# Patient Record
Sex: Female | Born: 1995 | Hispanic: Yes | Marital: Single | Smoking: Never smoker
Health system: Southern US, Community
[De-identification: ages and names within clinical notes are randomized; demographics above are authoritative.]

## PROBLEM LIST (undated history)

## (undated) DIAGNOSIS — Z789 Other specified health status: Secondary | ICD-10-CM

## (undated) HISTORY — PX: NO PAST SURGERIES: SHX2092

---

## 2016-05-15 NOTE — L&D Delivery Note (Signed)
Delivery Note At 8:37 AM a viable female was delivered via Vaginal, Spontaneous Delivery (Presentation: cephalic ;OP  ).  APGAR: 8, 9; weight  .  pending Placenta status: ,manual delivery intact .  Cord:  3 vessels with the following complications: .  Cord pH: not sent  Anesthesia: epidural   Episiotomy: None Lacerations:  none Suture Repair: n/a Est. Blood Loss (mL):  500 ml. Patient received methergine and buccal cytotec after placental removal  Mom to postpartum.  Baby to Couplet care / Skin to Skin.  Scheryl Darter 12/31/2016, 9:07 AM

## 2016-07-04 LAB — OB RESULTS CONSOLE RUBELLA ANTIBODY, IGM
RUBELLA: IMMUNE
Rubella: IMMUNE

## 2016-10-17 LAB — HIV-1 RNA, QUALITATIVE, TMA: HIV-1 RNA, Qualitative, TMA: NONREACTIVE

## 2016-10-17 LAB — OB RESULTS CONSOLE RPR
RPR: NONREACTIVE
RPR: NONREACTIVE

## 2016-10-17 LAB — OB RESULTS CONSOLE HIV ANTIBODY (ROUTINE TESTING): HIV: NONREACTIVE

## 2016-11-30 ENCOUNTER — Encounter: Payer: Self-pay | Admitting: Obstetrics & Gynecology

## 2016-11-30 ENCOUNTER — Ambulatory Visit (INDEPENDENT_AMBULATORY_CARE_PROVIDER_SITE_OTHER): Payer: Self-pay | Admitting: Obstetrics & Gynecology

## 2016-11-30 VITALS — BP 127/74 | HR 92 | Ht 61.0 in | Wt 171.0 lb

## 2016-11-30 DIAGNOSIS — O0990 Supervision of high risk pregnancy, unspecified, unspecified trimester: Secondary | ICD-10-CM | POA: Insufficient documentation

## 2016-11-30 DIAGNOSIS — Z3400 Encounter for supervision of normal first pregnancy, unspecified trimester: Secondary | ICD-10-CM | POA: Insufficient documentation

## 2016-11-30 DIAGNOSIS — Z3403 Encounter for supervision of normal first pregnancy, third trimester: Secondary | ICD-10-CM

## 2016-11-30 NOTE — Patient Instructions (Signed)
Third Trimester of Pregnancy The third trimester is from week 28 through week 40 (months 7 through 9). The third trimester is a time when the unborn baby (fetus) is growing rapidly. At the end of the ninth month, the fetus is about 20 inches in length and weighs 6-10 pounds. Body changes during your third trimester Your body will continue to go through many changes during pregnancy. The changes vary from woman to woman. During the third trimester:  Your weight will continue to increase. You can expect to gain 25-35 pounds (11-16 kg) by the end of the pregnancy.  You may begin to get stretch marks on your hips, abdomen, and breasts.  You may urinate more often because the fetus is moving lower into your pelvis and pressing on your bladder.  You may develop or continue to have heartburn. This is caused by increased hormones that slow down muscles in the digestive tract.  You may develop or continue to have constipation because increased hormones slow digestion and cause the muscles that push waste through your intestines to relax.  You may develop hemorrhoids. These are swollen veins (varicose veins) in the rectum that can itch or be painful.  You may develop swollen, bulging veins (varicose veins) in your legs.  You may have increased body aches in the pelvis, back, or thighs. This is due to weight gain and increased hormones that are relaxing your joints.  You may have changes in your hair. These can include thickening of your hair, rapid growth, and changes in texture. Some women also have hair loss during or after pregnancy, or hair that feels dry or thin. Your hair will most likely return to normal after your baby is born.  Your breasts will continue to grow and they will continue to become tender. A yellow fluid (colostrum) may leak from your breasts. This is the first milk you are producing for your baby.  Your belly button may stick out.  You may notice more swelling in your hands,  face, or ankles.  You may have increased tingling or numbness in your hands, arms, and legs. The skin on your belly may also feel numb.  You may feel short of breath because of your expanding uterus.  You may have more problems sleeping. This can be caused by the size of your belly, increased need to urinate, and an increase in your body's metabolism.  You may notice the fetus "dropping," or moving lower in your abdomen (lightening).  You may have increased vaginal discharge.  You may notice your joints feel loose and you may have pain around your pelvic bone.  What to expect at prenatal visits You will have prenatal exams every 2 weeks until week 36. Then you will have weekly prenatal exams. During a routine prenatal visit:  You will be weighed to make sure you and the baby are growing normally.  Your blood pressure will be taken.  Your abdomen will be measured to track your baby's growth.  The fetal heartbeat will be listened to.  Any test results from the previous visit will be discussed.  You may have a cervical check near your due date to see if your cervix has softened or thinned (effaced).  You will be tested for Group B streptococcus. This happens between 35 and 37 weeks.  Your health care provider may ask you:  What your birth plan is.  How you are feeling.  If you are feeling the baby move.  If you have had   any abnormal symptoms, such as leaking fluid, bleeding, severe headaches, or abdominal cramping.  If you are using any tobacco products, including cigarettes, chewing tobacco, and electronic cigarettes.  If you have any questions.  Other tests or screenings that may be performed during your third trimester include:  Blood tests that check for low iron levels (anemia).  Fetal testing to check the health, activity level, and growth of the fetus. Testing is done if you have certain medical conditions or if there are problems during the  pregnancy.  Nonstress test (NST). This test checks the health of your baby to make sure there are no signs of problems, such as the baby not getting enough oxygen. During this test, a belt is placed around your belly. The baby is made to move, and its heart rate is monitored during movement.  What is false labor? False labor is a condition in which you feel small, irregular tightenings of the muscles in the womb (contractions) that usually go away with rest, changing position, or drinking water. These are called Braxton Hicks contractions. Contractions may last for hours, days, or even weeks before true labor sets in. If contractions come at regular intervals, become more frequent, increase in intensity, or become painful, you should see your health care provider. What are the signs of labor?  Abdominal cramps.  Regular contractions that start at 10 minutes apart and become stronger and more frequent with time.  Contractions that start on the top of the uterus and spread down to the lower abdomen and back.  Increased pelvic pressure and dull back pain.  A watery or bloody mucus discharge that comes from the vagina.  Leaking of amniotic fluid. This is also known as your "water breaking." It could be a slow trickle or a gush. Let your health care provider know if it has a color or strange odor. If you have any of these signs, call your health care provider right away, even if it is before your due date. Follow these instructions at home: Medicines  Follow your health care provider's instructions regarding medicine use. Specific medicines may be either safe or unsafe to take during pregnancy.  Take a prenatal vitamin that contains at least 600 micrograms (mcg) of folic acid.  If you develop constipation, try taking a stool softener if your health care provider approves. Eating and drinking  Eat a balanced diet that includes fresh fruits and vegetables, whole grains, good sources of protein  such as meat, eggs, or tofu, and low-fat dairy. Your health care provider will help you determine the amount of weight gain that is right for you.  Avoid raw meat and uncooked cheese. These carry germs that can cause birth defects in the baby.  If you have low calcium intake from food, talk to your health care provider about whether you should take a daily calcium supplement.  Eat four or five small meals rather than three large meals a day.  Limit foods that are high in fat and processed sugars, such as fried and sweet foods.  To prevent constipation: ? Drink enough fluid to keep your urine clear or pale yellow. ? Eat foods that are high in fiber, such as fresh fruits and vegetables, whole grains, and beans. Activity  Exercise only as directed by your health care provider. Most women can continue their usual exercise routine during pregnancy. Try to exercise for 30 minutes at least 5 days a week. Stop exercising if you experience uterine contractions.  Avoid heavy   lifting.  Do not exercise in extreme heat or humidity, or at high altitudes.  Wear low-heel, comfortable shoes.  Practice good posture.  You may continue to have sex unless your health care provider tells you otherwise. Relieving pain and discomfort  Take frequent breaks and rest with your legs elevated if you have leg cramps or low back pain.  Take warm sitz baths to soothe any pain or discomfort caused by hemorrhoids. Use hemorrhoid cream if your health care provider approves.  Wear a good support bra to prevent discomfort from breast tenderness.  If you develop varicose veins: ? Wear support pantyhose or compression stockings as told by your healthcare provider. ? Elevate your feet for 15 minutes, 3-4 times a day. Prenatal care  Write down your questions. Take them to your prenatal visits.  Keep all your prenatal visits as told by your health care provider. This is important. Safety  Wear your seat belt at  all times when driving.  Make a list of emergency phone numbers, including numbers for family, friends, the hospital, and police and fire departments. General instructions  Avoid cat litter boxes and soil used by cats. These carry germs that can cause birth defects in the baby. If you have a cat, ask someone to clean the litter box for you.  Do not travel far distances unless it is absolutely necessary and only with the approval of your health care provider.  Do not use hot tubs, steam rooms, or saunas.  Do not drink alcohol.  Do not use any products that contain nicotine or tobacco, such as cigarettes and e-cigarettes. If you need help quitting, ask your health care provider.  Do not use any medicinal herbs or unprescribed drugs. These chemicals affect the formation and growth of the baby.  Do not douche or use tampons or scented sanitary pads.  Do not cross your legs for long periods of time.  To prepare for the arrival of your baby: ? Take prenatal classes to understand, practice, and ask questions about labor and delivery. ? Make a trial run to the hospital. ? Visit the hospital and tour the maternity area. ? Arrange for maternity or paternity leave through employers. ? Arrange for family and friends to take care of pets while you are in the hospital. ? Purchase a rear-facing car seat and make sure you know how to install it in your car. ? Pack your hospital bag. ? Prepare the baby's nursery. Make sure to remove all pillows and stuffed animals from the baby's crib to prevent suffocation.  Visit your dentist if you have not gone during your pregnancy. Use a soft toothbrush to brush your teeth and be gentle when you floss. Contact a health care provider if:  You are unsure if you are in labor or if your water has broken.  You become dizzy.  You have mild pelvic cramps, pelvic pressure, or nagging pain in your abdominal area.  You have lower back pain.  You have persistent  nausea, vomiting, or diarrhea.  You have an unusual or bad smelling vaginal discharge.  You have pain when you urinate. Get help right away if:  Your water breaks before 37 weeks.  You have regular contractions less than 5 minutes apart before 37 weeks.  You have a fever.  You are leaking fluid from your vagina.  You have spotting or bleeding from your vagina.  You have severe abdominal pain or cramping.  You have rapid weight loss or weight gain.    You have shortness of breath with chest pain.  You notice sudden or extreme swelling of your face, hands, ankles, feet, or legs.  Your baby makes fewer than 10 movements in 2 hours.  You have severe headaches that do not go away when you take medicine.  You have vision changes. Summary  The third trimester is from week 28 through week 40, months 7 through 9. The third trimester is a time when the unborn baby (fetus) is growing rapidly.  During the third trimester, your discomfort may increase as you and your baby continue to gain weight. You may have abdominal, leg, and back pain, sleeping problems, and an increased need to urinate.  During the third trimester your breasts will keep growing and they will continue to become tender. A yellow fluid (colostrum) may leak from your breasts. This is the first milk you are producing for your baby.  False labor is a condition in which you feel small, irregular tightenings of the muscles in the womb (contractions) that eventually go away. These are called Braxton Hicks contractions. Contractions may last for hours, days, or even weeks before true labor sets in.  Signs of labor can include: abdominal cramps; regular contractions that start at 10 minutes apart and become stronger and more frequent with time; watery or bloody mucus discharge that comes from the vagina; increased pelvic pressure and dull back pain; and leaking of amniotic fluid. This information is not intended to replace advice  given to you by your health care provider. Make sure you discuss any questions you have with your health care provider. Document Released: 04/25/2001 Document Revised: 10/07/2015 Document Reviewed: 07/02/2012 Elsevier Interactive Patient Education  2017 [employer]  

## 2016-11-30 NOTE — Progress Notes (Signed)
Patient is a G1P0 34 week 6 days 21 y/o female who presents to the clinic today to establish care for her current and first pregnancy. Patient recently moved back to [place], [place] from [place], [place] to be with her mother during her pregnancy. She is having a girl. The pt had an [place] x1 month ago in [place], and her most recent prenatal visit was on 11/14/16 in [place]. Patient reports that her pregnancy has been without issue, and has not been informed of any abnormal growth or anatomical anomalies, nor any placental abnormalities. She has been taking a prenatal vitamin daily, and takes no other medications. Patient has no relevant past medical history, including no DM nor HTN. Denies HA and significant new onset lower extremity swelling. She does report some mild foot swelling.  She is uncertain about what contraception methods she is interested in utilizing postpartum. Patient plans on breastfeeding. She desires a vaginal delivery with epidural. Patient has not attended any prenatal classes. She has not yet decided upon a Pediatrician. Her boyfriend Elita Quick will attend her labor. She has not had a Pap smear performed in the past.    Gynecologic Exam  The patient's pertinent negatives include no vaginal bleeding or vaginal discharge. The patient is experiencing no pain. She is pregnant. Pertinent negatives include no abdominal pain, chills, dysuria, fever, frequency, headaches, hematuria, nausea, rash, urgency or vomiting. There has been no bleeding. There is no history of an abdominal surgery, a Cesarean section, an ectopic pregnancy, a gynecological surgery, herpes simplex, miscarriage, ovarian cysts, PID, an STD or a terminated pregnancy.    Review of Systems  Constitutional: Negative for chills and fever.  Respiratory: Negative for shortness of breath.   Cardiovascular: Negative for chest pain.  Gastrointestinal: Negative for abdominal pain, nausea and vomiting.  Genitourinary: Negative for dysuria,  frequency, hematuria, urgency and vaginal discharge.  Skin: Negative for rash.  Neurological: Negative for headaches.    Physical Exam  Constitutional: She is oriented to person, place, and time. She appears well-developed and well-nourished. No distress.  HENT:  Head: Normocephalic and atraumatic.  Abdominal: Soft. She exhibits no distension. There is no tenderness. There is no guarding.    Gravid, 33 cm FH  Musculoskeletal: Normal range of motion. She exhibits no edema, tenderness or deformity.  Neurological: She is alert and oriented to person, place, and time.  Skin: Skin is warm and dry.  Psychiatric: She has a normal mood and affect. Her behavior is normal.     Miia was seen today for new patient (initial visit).  Diagnoses and all orders for this visit:  Supervision of normal first pregnancy, antepartum Comments: .CWHBOX  Orders: -     Cancel: Obstetric Panel, Including HIV -     Cancel: Hemoglobinopathy Evaluation -     Culture, OB Urine -     GC/Chlamydia probe amp ([place])not at [facility]   Assessment  Patient is a G1P0 34 week 6 days 21 y/o female who presents to establish care. Patient has no concerns or complaints today and is overall doing very well. Her BP is 127/74 in office today, which is borderline, and with consideration of her age and primagravida, we will continue to closely monitor the patient for preeclampsia.  Plan  Routine prenatal labs and screening.  She is to return to office in x1 week for her next prenatal appointment and for GBS screen.  Attestation of Attending Supervision of PA Student: Evaluation and management procedures were performed by the  student under my supervision and collaboration.  I have reviewed the student's note and chart, and I agree with the management and plan.  Scheryl DarterJAMES ARNOLD, MD, FACOG Attending Obstetrician & Gynecologist Faculty Practice, [facility]

## 2016-12-02 LAB — CULTURE, OB URINE

## 2016-12-02 LAB — URINE CULTURE, OB REFLEX

## 2016-12-12 ENCOUNTER — Ambulatory Visit (INDEPENDENT_AMBULATORY_CARE_PROVIDER_SITE_OTHER): Payer: Self-pay | Admitting: Certified Nurse Midwife

## 2016-12-12 VITALS — BP 123/81 | HR 101 | Wt 171.8 lb

## 2016-12-12 DIAGNOSIS — Z113 Encounter for screening for infections with a predominantly sexual mode of transmission: Secondary | ICD-10-CM

## 2016-12-12 DIAGNOSIS — Z3403 Encounter for supervision of normal first pregnancy, third trimester: Secondary | ICD-10-CM

## 2016-12-12 DIAGNOSIS — Z3400 Encounter for supervision of normal first pregnancy, unspecified trimester: Secondary | ICD-10-CM

## 2016-12-12 LAB — OB RESULTS CONSOLE GBS: GBS: NEGATIVE

## 2016-12-12 LAB — OB RESULTS CONSOLE GC/CHLAMYDIA
CHLAMYDIA, DNA PROBE: NEGATIVE
Gonorrhea: NEGATIVE

## 2016-12-12 NOTE — Progress Notes (Signed)
   PRENATAL VISIT NOTE  Subjective:  Lydia Petersen is a 21 y.o. G1P0 at 5059w4d being seen today for ongoing prenatal care.  She is currently monitored for the following issues for this low-risk pregnancy and has Supervision of normal first pregnancy, antepartum on her problem list.  Patient reports no complaints.   . Vag. Bleeding: None.  Movement: Present. Denies leaking of fluid.   The following portions of the patient's history were reviewed and updated as appropriate: allergies, current medications, past family history, past medical history, past social history, past surgical history and problem list. Problem list updated.  Objective:   Vitals:   12/12/16 1039  BP: 123/81  Pulse: (!) 101  Weight: 171 lb 12.8 oz (77.9 kg)    Fetal Status: Fetal Heart Rate (bpm): 143 Fundal Height: 36 cm Movement: Present  Presentation: Vertex  General:  Alert, oriented and cooperative. Patient is in no acute distress.  Skin: Skin is warm and dry. No rash noted.   Cardiovascular: Normal heart rate noted  Respiratory: Normal respiratory effort, no problems with respiration noted  Abdomen: Soft, gravid, appropriate for gestational age.  Pain/Pressure: Present     Pelvic: Cervical exam deferred        Extremities: Normal range of motion.  Edema: Trace  Mental Status:  Normal mood and affect. Normal behavior. Normal judgment and thought content.   Assessment and Plan:  Pregnancy: G1P0 at 6159w4d  1. Encounter for supervision of normal first pregnancy in third trimester - [facility] Culture, beta strep (group b only) - GC/Chlamydia   Term labor symptoms and general obstetric precautions including but not limited to vaginal bleeding, contractions, leaking of fluid and fetal movement were reviewed in detail with the patient. Please refer to After Visit Summary for other counseling recommendations.  Return in about 1 week (around  12/19/2016).   Cleone Slimaroline Elian Gloster, Student-MidWife

## 2016-12-12 NOTE — Progress Notes (Signed)
123/81 

## 2016-12-13 LAB — CERVICOVAGINAL ANCILLARY ONLY
Chlamydia: NEGATIVE
NEISSERIA GONORRHEA: NEGATIVE

## 2016-12-16 LAB — CULTURE, BETA STREP (GROUP B ONLY): STREP GP B CULTURE: NEGATIVE

## 2016-12-19 ENCOUNTER — Ambulatory Visit (INDEPENDENT_AMBULATORY_CARE_PROVIDER_SITE_OTHER): Payer: Self-pay | Admitting: Obstetrics and Gynecology

## 2016-12-19 VITALS — BP 130/83 | HR 83 | Wt 173.1 lb

## 2016-12-19 DIAGNOSIS — Z3400 Encounter for supervision of normal first pregnancy, unspecified trimester: Secondary | ICD-10-CM

## 2016-12-19 DIAGNOSIS — Z3403 Encounter for supervision of normal first pregnancy, third trimester: Secondary | ICD-10-CM

## 2016-12-19 NOTE — Patient Instructions (Signed)
Vaginal Delivery Vaginal delivery means that you will give birth by pushing your baby out of your birth canal (vagina). A team of health care providers will help you before, during, and after vaginal delivery. Birth experiences are unique for every woman and every pregnancy, and birth experiences vary depending on where you choose to give birth. What should I do to prepare for my baby's birth? Before your baby is born, it is important to talk with your health care provider about:  Your labor and delivery preferences. These may include: ? Medicines that you may be given. ? How you will manage your pain. This might include non-medical pain relief techniques or injectable pain relief such as epidural analgesia. ? How you and your baby will be monitored during labor and delivery. ? Who may be in the labor and delivery room with you. ? Your feelings about surgical delivery of your baby (cesarean delivery, or C-section) if this becomes necessary. ? Your feelings about receiving donated blood through an IV tube (blood transfusion) if this becomes necessary.  Whether you are able: ? To take pictures or videos of the birth. ? To eat during labor and delivery. ? To move around, walk, or change positions during labor and delivery.  What to expect after your baby is born, such as: ? Whether delayed umbilical cord clamping and cutting is offered. ? Who will care for your baby right after birth. ? Medicines or tests that may be recommended for your baby. ? Whether breastfeeding is supported in your hospital or birth center. ? How long you will be in the hospital or birth center.  How any medical conditions you have may affect your baby or your labor and delivery experience.  To prepare for your baby's birth, you should also:  Attend all of your health care visits before delivery (prenatal visits) as recommended by your health care provider. This is important.  Prepare your home for your baby's  arrival. Make sure that you have: ? Diapers. ? Baby clothing. ? Feeding equipment. ? Safe sleeping arrangements for you and your baby.  Install a car seat in your vehicle. Have your car seat checked by a certified car seat installer to make sure that it is installed safely.  Think about who will help you with your new baby at home for at least the first several weeks after delivery.  What can I expect when I arrive at the birth center or hospital? Once you are in labor and have been admitted into the hospital or birth center, your health care provider may:  Review your pregnancy history and any concerns you have.  Insert an IV tube into one of your veins. This is used to give you fluids and medicines.  Check your blood pressure, pulse, temperature, and heart rate (vital signs).  Check whether your bag of water (amniotic sac) has broken (ruptured).  Talk with you about your birth plan and discuss pain control options.  Monitoring Your health care provider may monitor your contractions (uterine monitoring) and your baby's heart rate (fetal monitoring). You may need to be monitored:  Often, but not continuously (intermittently).  All the time or for long periods at a time (continuously). Continuous monitoring may be needed if: ? You are taking certain medicines, such as medicine to relieve pain or make your contractions stronger. ? You have pregnancy or labor complications.  Monitoring may be done by:  Placing a special stethoscope or a handheld monitoring device on your abdomen to   check your baby's heartbeat, and feeling your abdomen for contractions. This method of monitoring does not continuously record your baby's heartbeat or your contractions.  Placing monitors on your abdomen (external monitors) to record your baby's heartbeat and the frequency and length of contractions. You may not have to wear external monitors all the time.  Placing monitors inside of your uterus  (internal monitors) to record your baby's heartbeat and the frequency, length, and strength of your contractions. ? Your health care provider may use internal monitors if he or she needs more information about the strength of your contractions or your baby's heart rate. ? Internal monitors are put in place by passing a thin, flexible wire through your vagina and into your uterus. Depending on the type of monitor, it may remain in your uterus or on your baby's head until birth. ? Your health care provider will discuss the benefits and risks of internal monitoring with you and will ask for your permission before inserting the monitors.  Telemetry. This is a type of continuous monitoring that can be done with external or internal monitors. Instead of having to stay in bed, you are able to move around during telemetry. Ask your health care provider if telemetry is an option for you.  Physical exam Your health care provider may perform a physical exam. This may include:  Checking whether your baby is positioned: ? With the head toward your vagina (head-down). This is most common. ? With the head toward the top of your uterus (head-up or breech). If your baby is in a breech position, your health care provider may try to turn your baby to a head-down position so you can deliver vaginally. If it does not seem that your baby can be born vaginally, your provider may recommend surgery to deliver your baby. In rare cases, you may be able to deliver vaginally if your baby is head-up (breech delivery). ? Lying sideways (transverse). Babies that are lying sideways cannot be delivered vaginally.  Checking your cervix to determine: ? Whether it is thinning out (effacing). ? Whether it is opening up (dilating). ? How low your baby has moved into your birth canal.  What are the three stages of labor and delivery?  Normal labor and delivery is divided into the following three stages: Stage 1  Stage 1 is the  longest stage of labor, and it can last for hours or days. Stage 1 includes: ? Early labor. This is when contractions may be irregular, or regular and mild. Generally, early labor contractions are more than 10 minutes apart. ? Active labor. This is when contractions get longer, more regular, more frequent, and more intense. ? The transition phase. This is when contractions happen very close together, are very intense, and may last longer than during any other part of labor.  Contractions generally feel mild, infrequent, and irregular at first. They get stronger, more frequent (about every 2-3 minutes), and more regular as you progress from early labor through active labor and transition.  Many women progress through stage 1 naturally, but you may need help to continue making progress. If this happens, your health care provider may talk with you about: ? Rupturing your amniotic sac if it has not ruptured yet. ? Giving you medicine to help make your contractions stronger and more frequent.  Stage 1 ends when your cervix is completely dilated to 4 inches (10 cm) and completely effaced. This happens at the end of the transition phase. Stage 2  Once   your cervix is completely effaced and dilated to 4 inches (10 cm), you may start to feel an urge to push. It is common for the body to naturally take a rest before feeling the urge to push, especially if you received an epidural or certain other pain medicines. This rest period may last for up to 1-2 hours, depending on your unique labor experience.  During stage 2, contractions are generally less painful, because pushing helps relieve contraction pain. Instead of contraction pain, you may feel stretching and burning pain, especially when the widest part of your baby's head passes through the vaginal opening (crowning).  Your health care provider will closely monitor your pushing progress and your baby's progress through the vagina during stage 2.  Your  health care provider may massage the area of skin between your vaginal opening and anus (perineum) or apply warm compresses to your perineum. This helps it stretch as the baby's head starts to crown, which can help prevent perineal tearing. ? In some cases, an incision may be made in your perineum (episiotomy) to allow the baby to pass through the vaginal opening. An episiotomy helps to make the opening of the vagina larger to allow more room for the baby to fit through.  It is very important to breathe and focus so your health care provider can control the delivery of your baby's head. Your health care provider may have you decrease the intensity of your pushing, to help prevent perineal tearing.  After delivery of your baby's head, the shoulders and the rest of the body generally deliver very quickly and without difficulty.  Once your baby is delivered, the umbilical cord may be cut right away, or this may be delayed for 1-2 minutes, depending on your baby's health. This may vary among health care providers, hospitals, and birth centers.  If you and your baby are healthy enough, your baby may be placed on your chest or abdomen to help maintain the baby's temperature and to help you bond with each other. Some mothers and babies start breastfeeding at this time. Your health care team will dry your baby and help keep your baby warm during this time.  Your baby may need immediate care if he or she: ? Showed signs of distress during labor. ? Has a medical condition. ? Was born too early (prematurely). ? Had a bowel movement before birth (meconium). ? Shows signs of difficulty transitioning from being inside the uterus to being outside of the uterus. If you are planning to breastfeed, your health care team will help you begin a feeding. Stage 3  The third stage of labor starts immediately after the birth of your baby and ends after you deliver the placenta. The placenta is an organ that develops  during pregnancy to provide oxygen and nutrients to your baby in the womb.  Delivering the placenta may require some pushing, and you may have mild contractions. Breastfeeding can stimulate contractions to help you deliver the placenta.  After the placenta is delivered, your uterus should tighten (contract) and become firm. This helps to stop bleeding in your uterus. To help your uterus contract and to control bleeding, your health care provider may: ? Give you medicine by injection, through an IV tube, by mouth, or through your rectum (rectally). ? Massage your abdomen or perform a vaginal exam to remove any blood clots that are left in your uterus. ? Empty your bladder by placing a thin, flexible tube (catheter) into your bladder. ? Encourage   you to breastfeed your baby. After labor is over, you and your baby will be monitored closely to ensure that you are both healthy until you are ready to go home. Your health care team will teach you how to care for yourself and your baby. This information is not intended to replace advice given to you by your health care provider. Make sure you discuss any questions you have with your health care provider. Document Released: 02/08/2008 Document Revised: 11/19/2015 Document Reviewed: 05/16/2015 Elsevier Interactive Patient Education  2018 [employer]  

## 2016-12-19 NOTE — Progress Notes (Signed)
Subjective:  Lydia Petersen is a 21 y.o. G1P0 at 6860w4d being seen today for ongoing prenatal care.  She is currently monitored for the following issues for this low-risk pregnancy and has Supervision of normal first pregnancy, antepartum on her problem list.  Patient reports no complaints.  Contractions: Irregular. Vag. Bleeding: None.  Movement: Present. Denies leaking of fluid.   The following portions of the patient's history were reviewed and updated as appropriate: allergies, current medications, past family history, past medical history, past social history, past surgical history and problem list. Problem list updated.  Objective:   Vitals:   12/19/16 0828  BP: 130/83  Pulse: 83  Weight: 173 lb 1.6 oz (78.5 kg)    Fetal Status: Fetal Heart Rate (bpm): 154 Fundal Height: 37 cm Movement: Present  Presentation: Vertex  General:  Alert, oriented and cooperative. Patient is in no acute distress.  Skin: Skin is warm and dry. No rash noted.   Cardiovascular: Normal heart rate noted  Respiratory: Normal respiratory effort, no problems with respiration noted  Abdomen: Soft, gravid, appropriate for gestational age. Pain/Pressure: Present     Pelvic: Vag. Bleeding: None     Cervical exam performed Dilation: Fingertip Effacement (%): Thick Station: -3  Extremities: Normal range of motion.  Edema: Trace  Mental Status: Normal mood and affect. Normal behavior. Normal judgment and thought content.    Assessment and Plan:  Pregnancy: G1P0 at 2160w4d  1. Supervision of normal first pregnancy, antepartum Continue routine prenatal care. Labs from last visit reviewed. Patient is GBS negative. Still awaiting records from [facility]. Reviewed labs on patient's MyChart from prior OB and updated information.   Term labor symptoms and general obstetric precautions including but not limited to vaginal bleeding, contractions, leaking of fluid and fetal movement were reviewed in detail with the  patient. Please refer to After Visit Summary for other counseling recommendations.  Return in about 1 week (around 12/26/2016) for ob visit.  Caryl AdaJazma Diella Gillingham, DO [facility], [facility] 12/19/2016, 8:55 AM

## 2016-12-26 ENCOUNTER — Ambulatory Visit (INDEPENDENT_AMBULATORY_CARE_PROVIDER_SITE_OTHER): Payer: Self-pay | Admitting: Obstetrics and Gynecology

## 2016-12-26 VITALS — BP 137/83 | HR 86 | Wt 172.9 lb

## 2016-12-26 DIAGNOSIS — B379 Candidiasis, unspecified: Secondary | ICD-10-CM | POA: Insufficient documentation

## 2016-12-26 DIAGNOSIS — Z3403 Encounter for supervision of normal first pregnancy, third trimester: Secondary | ICD-10-CM

## 2016-12-26 DIAGNOSIS — Z3400 Encounter for supervision of normal first pregnancy, unspecified trimester: Secondary | ICD-10-CM

## 2016-12-26 MED ORDER — TERCONAZOLE 0.4 % VA CREA: 1.0000 | TOPICAL_CREAM | Freq: Every day | VAGINAL | 0 refills | Status: DC

## 2016-12-26 NOTE — Progress Notes (Addendum)
   PRENATAL VISIT NOTE  Subjective:  Lydia Petersen is a 21 y.o. G1P0 at 7276w4d being seen today for ongoing prenatal care.  She is currently monitored for the following issues for this low-risk pregnancy and has Supervision of normal first pregnancy, antepartum and Yeast infection on her problem list.  Patient reports an increase in clear vaginal discharge. Not everyday, however at times she feels her underwear are wet. .  Contractions: Irregular. Vag. Bleeding: None.  Movement: Present. Denies leaking of fluid.   The following portions of the patient's history were reviewed and updated as appropriate: allergies, current medications, past family history, past medical history, past social history, past surgical history and problem list. Problem list updated.  Objective:   Vitals:   12/26/16 0955  BP: 137/83  Pulse: 86  Weight: 172 lb 14.4 oz (78.4 kg)    Fetal Status: Fetal Heart Rate (bpm): 142 Fundal Height: 37 cm Movement: Present     General:  Alert, oriented and cooperative. Patient is in no acute distress.  Skin: Skin is warm and dry. No rash noted.   Cardiovascular: Normal heart rate noted  Respiratory: Normal respiratory effort, no problems with respiration noted  Abdomen: Soft, gravid, appropriate for gestational age.  Pain/Pressure: Absent     Pelvic: Cervical exam performed       moderate amount of clear, thick/white, clumpy discharge on the walls of vagina. No pooling of fluid. Cervical check deferred. Fern slide negative.   Extremities: Normal range of motion.  Edema: Trace  Mental Status:  Normal mood and affect. Normal behavior. Normal judgment and thought content.   Assessment and Plan:  Pregnancy: G1P0 at 276w4d  1. Supervision of normal first pregnancy, antepartum  - Records again requested. No US on file of date. Discussed with Dr. Adrian BlackwaterStinson. US recommended.  - GBS negative, discussed with patient.  - US MFM OB COMP + 14 WK; Future  2. Yeast  infection  - terconazole (TERAZOL 7) 0.4 % vaginal cream; Place 1 applicator vaginally at bedtime.  Dispense: 45 g; Refill: 0   Term labor symptoms and general obstetric precautions including but not limited to vaginal bleeding, contractions, leaking of fluid and fetal movement were reviewed in detail with the patient. Please refer to After Visit Summary for other counseling recommendations.  Return in about 1 week (around 01/02/2017).    Ilani Otterson, Harolyn RutherfordJennifer I, NP

## 2016-12-28 ENCOUNTER — Other Ambulatory Visit: Payer: Self-pay | Admitting: Obstetrics and Gynecology

## 2016-12-28 ENCOUNTER — Ambulatory Visit (HOSPITAL_COMMUNITY)
Admission: RE | Admit: 2016-12-28 | Discharge: 2016-12-28 | Disposition: A | Discharge status: Home / Self Care | Payer: Self-pay | Source: Ambulatory Visit | Attending: Obstetrics and Gynecology | Admitting: Obstetrics and Gynecology

## 2016-12-28 DIAGNOSIS — Z3689 Encounter for other specified antenatal screening: Secondary | ICD-10-CM | POA: Insufficient documentation

## 2016-12-28 DIAGNOSIS — Z3400 Encounter for supervision of normal first pregnancy, unspecified trimester: Secondary | ICD-10-CM

## 2016-12-28 DIAGNOSIS — Z3A38 38 weeks gestation of pregnancy: Secondary | ICD-10-CM | POA: Insufficient documentation

## 2016-12-29 ENCOUNTER — Inpatient Hospital Stay (HOSPITAL_COMMUNITY)
Admission: AD | Admit: 2016-12-29 | Discharge: 2017-01-02 | DRG: 774 | Disposition: A | Discharge status: Home / Self Care | Payer: Medicaid Other | Source: Ambulatory Visit | Attending: Obstetrics and Gynecology | Admitting: Obstetrics and Gynecology

## 2016-12-29 ENCOUNTER — Encounter (HOSPITAL_COMMUNITY): Payer: Self-pay

## 2016-12-29 DIAGNOSIS — O864 Pyrexia of unknown origin following delivery: Secondary | ICD-10-CM | POA: Diagnosis not present

## 2016-12-29 DIAGNOSIS — O4292 Full-term premature rupture of membranes, unspecified as to length of time between rupture and onset of labor: Secondary | ICD-10-CM | POA: Diagnosis present

## 2016-12-29 DIAGNOSIS — O4290 Premature rupture of membranes, unspecified as to length of time between rupture and onset of labor, unspecified weeks of gestation: Secondary | ICD-10-CM | POA: Diagnosis present

## 2016-12-29 DIAGNOSIS — Z3A39 39 weeks gestation of pregnancy: Secondary | ICD-10-CM

## 2016-12-29 DIAGNOSIS — O4212 Full-term premature rupture of membranes, onset of labor more than 24 hours following rupture: Secondary | ICD-10-CM | POA: Diagnosis not present

## 2016-12-29 HISTORY — DX: Other specified health status: Z78.9

## 2016-12-29 LAB — COMPREHENSIVE METABOLIC PANEL
ALBUMIN: 2.8 g/dL — AB (ref 3.5–5.0)
ALT: 19 U/L (ref 14–54)
ANION GAP: 12 (ref 5–15)
AST: 20 U/L (ref 15–41)
Alkaline Phosphatase: 221 U/L — ABNORMAL HIGH (ref 38–126)
BILIRUBIN TOTAL: 0.5 mg/dL (ref 0.3–1.2)
BUN: 18 mg/dL (ref 6–20)
CHLORIDE: 107 mmol/L (ref 101–111)
CO2: 18 mmol/L — ABNORMAL LOW (ref 22–32)
Calcium: 9.1 mg/dL (ref 8.9–10.3)
Creatinine, Ser: 0.72 mg/dL (ref 0.44–1.00)
GFR calc Af Amer: >60 mL/min (ref >60)
GLUCOSE: 93 mg/dL (ref 65–99)
POTASSIUM: 3.9 mmol/L (ref 3.5–5.1)
Sodium: 137 mmol/L (ref 135–145)
TOTAL PROTEIN: 6.4 g/dL — AB (ref 6.5–8.1)

## 2016-12-29 LAB — URINALYSIS, ROUTINE W REFLEX MICROSCOPIC
BILIRUBIN URINE: NEGATIVE
GLUCOSE, UA: NEGATIVE mg/dL
KETONES UR: NEGATIVE mg/dL
Nitrite: NEGATIVE
PROTEIN: 100 mg/dL — AB
Specific Gravity, Urine: 1.024 (ref 1.005–1.030)
pH: 6 (ref 5.0–8.0)

## 2016-12-29 LAB — CBC
HEMATOCRIT: 38.9 % (ref 36.0–46.0)
Hemoglobin: 13 g/dL (ref 12.0–15.0)
MCH: 29.2 pg (ref 26.0–34.0)
MCHC: 33.4 g/dL (ref 30.0–36.0)
MCV: 87.4 fL (ref 78.0–100.0)
PLATELETS: 184 10*3/uL (ref 150–400)
RBC: 4.45 MIL/uL (ref 3.87–5.11)
RDW: 14.2 % (ref 11.5–15.5)
WBC: 8.8 10*3/uL (ref 4.0–10.5)

## 2016-12-29 LAB — POCT FERN TEST: POCT Fern Test: POSITIVE

## 2016-12-29 LAB — PROTEIN / CREATININE RATIO, URINE
Creatinine, Urine: 212 mg/dL
PROTEIN CREATININE RATIO: 0.45 mg/mg{creat} — AB (ref 0.00–0.15)
TOTAL PROTEIN, URINE: 95 mg/dL

## 2016-12-29 LAB — TYPE AND SCREEN
ABO/RH(D): O POS
ANTIBODY SCREEN: NEGATIVE

## 2016-12-29 MED ORDER — EPHEDRINE 5 MG/ML INJ
10.0000 mg | INTRAVENOUS | Status: DC | PRN
Start: 2016-12-29 — End: 2017-01-02
  Filled 2016-12-29: qty 2

## 2016-12-29 MED ORDER — OXYCODONE-ACETAMINOPHEN 5-325 MG PO TABS: 2.0000 | ORAL_TABLET | ORAL | Status: DC | PRN

## 2016-12-29 MED ORDER — LACTATED RINGERS IV SOLN: 500.0000 mL | INTRAVENOUS | Status: DC | PRN

## 2016-12-29 MED ORDER — FENTANYL 2.5 MCG/ML BUPIVACAINE 1/10 % EPIDURAL INFUSION (WH - ANES)
14.0000 mL/h | INTRAMUSCULAR | Status: DC | PRN
  Administered 2016-12-30 (×2): 14 mL/h via EPIDURAL
  Administered 2016-12-30: 12 mL/h via EPIDURAL
  Filled 2016-12-29 (×2): qty 100

## 2016-12-29 MED ORDER — FENTANYL CITRATE (PF) 100 MCG/2ML IJ SOLN
50.0000 ug | INTRAMUSCULAR | Status: DC | PRN
  Administered 2016-12-30: 100 ug via INTRAVENOUS
  Filled 2016-12-29: qty 2

## 2016-12-29 MED ORDER — OXYTOCIN BOLUS FROM INFUSION
500.0000 mL | Freq: Once | INTRAVENOUS | Status: AC
  Administered 2016-12-31: 500 mL via INTRAVENOUS

## 2016-12-29 MED ORDER — SOD CITRATE-CITRIC ACID 500-334 MG/5ML PO SOLN: 30.0000 mL | ORAL | Status: DC | PRN

## 2016-12-29 MED ORDER — ACETAMINOPHEN 325 MG PO TABS
650.0000 mg | ORAL_TABLET | ORAL | Status: DC | PRN
  Administered 2016-12-31: 650 mg via ORAL
  Filled 2016-12-29: qty 2

## 2016-12-29 MED ORDER — LACTATED RINGERS IV SOLN
500.0000 mL | Freq: Once | INTRAVENOUS | Status: AC
  Administered 2016-12-30: 500 mL via INTRAVENOUS

## 2016-12-29 MED ORDER — LIDOCAINE HCL (PF) 1 % IJ SOLN
30.0000 mL | INTRAMUSCULAR | Status: DC | PRN
  Filled 2016-12-29: qty 30

## 2016-12-29 MED ORDER — DIPHENHYDRAMINE HCL 50 MG/ML IJ SOLN: 12.5000 mg | INTRAMUSCULAR | Status: DC | PRN

## 2016-12-29 MED ORDER — OXYCODONE-ACETAMINOPHEN 5-325 MG PO TABS: 1.0000 | ORAL_TABLET | ORAL | Status: DC | PRN

## 2016-12-29 MED ORDER — PHENYLEPHRINE 40 MCG/ML (10ML) SYRINGE FOR IV PUSH (FOR BLOOD PRESSURE SUPPORT)
80.0000 ug | PREFILLED_SYRINGE | INTRAVENOUS | Status: DC | PRN
Start: 2016-12-29 — End: 2017-01-02
  Filled 2016-12-29: qty 5
  Filled 2016-12-29: qty 10

## 2016-12-29 MED ORDER — PHENYLEPHRINE 40 MCG/ML (10ML) SYRINGE FOR IV PUSH (FOR BLOOD PRESSURE SUPPORT)
80.0000 ug | PREFILLED_SYRINGE | INTRAVENOUS | Status: DC | PRN
  Filled 2016-12-29: qty 5

## 2016-12-29 MED ORDER — LACTATED RINGERS IV SOLN
INTRAVENOUS | Status: DC
  Administered 2016-12-30 (×2): 125 mL/h via INTRAVENOUS
  Administered 2016-12-30 – 2016-12-31 (×2): via INTRAVENOUS

## 2016-12-29 MED ORDER — OXYTOCIN 40 UNITS IN LACTATED RINGERS INFUSION - SIMPLE MED
2.5000 [IU]/h | INTRAVENOUS | Status: DC
  Administered 2016-12-31: 2.5 [IU]/h via INTRAVENOUS

## 2016-12-29 MED ORDER — ONDANSETRON HCL 4 MG/2ML IJ SOLN: 4.0000 mg | Freq: Four times a day (QID) | INTRAMUSCULAR | Status: DC | PRN

## 2016-12-29 NOTE — Progress Notes (Addendum)
Pt in WC with belongings to rm 174.

## 2016-12-29 NOTE — H&P (Signed)
Lydia Petersen is a 21 y.o. female G1P0 at 76 weeks presenting for evaluation of leakage of fluid. Patient reports soaking a pad an hour ago with continuous leakage on her way to the hospital. She reports some mild contractions. She has had uncomplicated prenatal care at [facility] since 34 weeks (transfer from [place]). She reports good fetal movement and denies vaginal bleeding. She denies HA, visual changes, RUQ/epigastric pain  OB History    Gravida Para Term Preterm AB Living   1             SAB TAB Ectopic Multiple Live Births                 Past Medical History:  Diagnosis Date  . Medical history non-contributory    Past Surgical History:  Procedure Laterality Date  . NO PAST SURGERIES     Family History: family history is not on file. Social History:  reports that she has never smoked. She has never used smokeless tobacco. She reports that she does not drink alcohol or use drugs.     Maternal Diabetes: No Genetic Screening: Declined Maternal Ultrasounds/Referrals: Normal Fetal Ultrasounds or other Referrals:  None Maternal Substance Abuse:  No Significant Maternal Medications:  None Significant Maternal Lab Results:  None Other Comments:  None  ROS  See pertinent in HPI History   Blood pressure (!) 135/96, temperature 97.8 F (36.6 C), resp. rate 18, height 5\' 1"  (1.549 m), weight 173 lb (78.5 kg), last menstrual period 03/31/2016. Exam Physical Exam  GENERAL: Well-developed, well-nourished female in no acute distress.  LUNGS: Clear to auscultation bilaterally.  HEART: Regular rate and rhythm. ABDOMEN: Soft, nontender, gravid CERVIX: 2/50/-3. + pooling + ferning EXTREMITIES: No cyanosis, clubbing, or edema, 2+ distal pulses.  Prenatal labs: ABO, Rh:  O pos Antibody:  Neg Rubella: Immune (02/20 0000) RPR: Nonreactive (06/05 0000)  HBsAg:   Negative HIV:   NR GBS:   Negative  Assessment/Plan: 21 yo G1P0 at 39 weeks with PROM - Admit to L&D - Pain  management prn - Will send cbc, cmp and U P:C given elevated BP  - Patient plans to breast feed and is undecided on contraception   Victorio Creeden 12/29/2016, 8:52 PM

## 2016-12-29 NOTE — MAU Note (Signed)
Leaked fld to where I soaked a regular pad about an hour ago. Clear fld. Cont to leak fld. Feel stomach tightening but not painful. Was one cm last sve

## 2016-12-30 ENCOUNTER — Inpatient Hospital Stay (HOSPITAL_COMMUNITY): Payer: Medicaid Other | Admitting: Anesthesiology

## 2016-12-30 LAB — ABO/RH: ABO/RH(D): O POS

## 2016-12-30 LAB — RPR: RPR: NONREACTIVE

## 2016-12-30 MED ORDER — EPHEDRINE 5 MG/ML INJ
10.0000 mg | INTRAVENOUS | Status: DC | PRN
Start: 2016-12-30 — End: 2016-12-30

## 2016-12-30 MED ORDER — DIPHENHYDRAMINE HCL 50 MG/ML IJ SOLN: 12.5000 mg | INTRAMUSCULAR | Status: DC | PRN

## 2016-12-30 MED ORDER — PHENYLEPHRINE 40 MCG/ML (10ML) SYRINGE FOR IV PUSH (FOR BLOOD PRESSURE SUPPORT): 80.0000 ug | PREFILLED_SYRINGE | INTRAVENOUS | Status: DC | PRN

## 2016-12-30 MED ORDER — EPHEDRINE 5 MG/ML INJ: 10.0000 mg | INTRAVENOUS | Status: DC | PRN

## 2016-12-30 MED ORDER — TERBUTALINE SULFATE 1 MG/ML IJ SOLN
0.2500 mg | Freq: Once | INTRAMUSCULAR | Status: DC | PRN
  Filled 2016-12-30: qty 1

## 2016-12-30 MED ORDER — LACTATED RINGERS IV SOLN: 500.0000 mL | Freq: Once | INTRAVENOUS | Status: DC

## 2016-12-30 MED ORDER — LIDOCAINE HCL (PF) 1 % IJ SOLN
INTRAMUSCULAR | Status: DC | PRN
Start: 2016-12-30 — End: 2016-12-31
  Administered 2016-12-30 (×2): 5 mL via EPIDURAL

## 2016-12-30 MED ORDER — OXYTOCIN 40 UNITS IN LACTATED RINGERS INFUSION - SIMPLE MED
1.0000 m[IU]/min | INTRAVENOUS | Status: DC
  Administered 2016-12-30: 2 m[IU]/min via INTRAVENOUS
  Filled 2016-12-30: qty 1000

## 2016-12-30 MED ORDER — FENTANYL CITRATE (PF) 100 MCG/2ML IJ SOLN: 100.0000 ug | INTRAMUSCULAR | Status: DC | PRN

## 2016-12-30 MED ORDER — FENTANYL 2.5 MCG/ML BUPIVACAINE 1/10 % EPIDURAL INFUSION (WH - ANES): 14.0000 mL/h | INTRAMUSCULAR | Status: DC | PRN

## 2016-12-30 NOTE — Progress Notes (Signed)
Patient ID: Lydia Petersen, female   DOB: 06-30-1995, 21 y.o.   MRN: [id number]  Pt resting; feels ctx less often  BP 105/63, 103/61, other VSS FHR 120-130s, +accels, no decels Irreg ctx Cx deferred  IUP@term  PROM ? Latent labor GBS neg  Rev'd w/ pt the options of augmenting labor with Pit vs expectant management; pt elects for expectant management now. Exam deferred due to rupture and no desire for Pit currently.  Cam Hai CNM 12/30/2016 2:35 AM

## 2016-12-30 NOTE — Anesthesia Preprocedure Evaluation (Signed)
Anesthesia Evaluation  Patient identified by MRN, date of birth, ID band Patient awake    Reviewed: Allergy & Precautions, NPO status , Patient's Chart, lab work & pertinent test results  History of Anesthesia Complications Negative for: history of anesthetic complications  Airway Mallampati: III  TM Distance: >3 FB Neck ROM: Full    Dental  (+) Dental Advisory Given   Pulmonary neg pulmonary ROS,    breath sounds clear to auscultation       Cardiovascular negative cardio ROS   Rhythm:Regular Rate:Normal     Neuro/Psych negative neurological ROS     GI/Hepatic Neg liver ROS, GERD  ,  Endo/Other  negative endocrine ROS  Renal/GU negative Renal ROS     Musculoskeletal   Abdominal   Peds  Hematology plt 184k   Anesthesia Other Findings   Reproductive/Obstetrics (+) Pregnancy                             Anesthesia Physical Anesthesia Plan  ASA: II  Anesthesia Plan: Epidural   Post-op Pain Management:    Induction:   PONV Risk Score and Plan: 2 and Treatment may vary due to age or medical condition  Airway Management Planned: Natural Airway  Additional Equipment:   Intra-op Plan:   Post-operative Plan:   Informed Consent: I have reviewed the patients History and Physical, chart, labs and discussed the procedure including the risks, benefits and alternatives for the proposed anesthesia with the patient or authorized representative who has indicated his/her understanding and acceptance.   Dental advisory given  Plan Discussed with: CRNA and Surgeon  Anesthesia Plan Comments: (Patient identified. Risks/Benefits/Options discussed with patient including but not limited to bleeding, infection, nerve damage, paralysis, failed block, incomplete pain control, headache, blood pressure changes, nausea, vomiting, reactions to medication both or allergic, itching and postpartum back pain.  Confirmed with bedside nurse the patient's most recent platelet count. Confirmed with patient that they are not currently taking any anticoagulation, have any bleeding history or any family history of bleeding disorders. Patient expressed understanding and wished to proceed. All questions were answered. )        Anesthesia Quick Evaluation

## 2016-12-30 NOTE — Progress Notes (Signed)
Labor Progress Note  S: Patient seen & examined for progress of labor. Patient comfortable and has no complaints or concerns.  O: BP 124/77   Pulse 78   Temp 98.1 F (36.7 C) (Oral)   Resp 20   Ht 5\' 1"  (1.549 m)   Wt 78.5 kg (173 lb)   LMP 03/31/2016   BMI 32.69 kg/m   FHT: 150bpm, mod var, +accels, no decels TOCO: q59min, patient looks comfortable during contractions  CVE: Dilation: 4 Effacement (%): 70 Station: -3 Presentation: Vertex Exam by:: Malva Cogan RN   Presentation cephalic verified by bedside US.  A&P: 21 y.o. G1P0 [redacted]w[redacted]d here for spontaneous ROM and labor management.  Will continue to monitor blood pressure closely given urine protein/creatinine ratio of 0.45.  Other preeclampsia labs negative. Will start 2 milli-units of pitocin. Continue labor augmentation Anticipate SVD.  Lezlie Octave, MD [facility] Resident PGY-1 12/30/2016 11:54 AM

## 2016-12-30 NOTE — Progress Notes (Signed)
Labor Progress Note  S: Patient seen & examined for progress of labor. Patient comfortable and has no complaints or concerns.  Receiving epidural in the room when I saw her.  O: BP 114/70   Pulse 76   Temp 98 F (36.7 C) (Oral)   Resp 16   Ht 5\' 1"  (1.549 m)   Wt 78.5 kg (173 lb)   LMP 03/31/2016   SpO2 100%   BMI 32.69 kg/m   FHT: 130bpm, mod var, +accels, no decels TOCO: q3-1min, patient looks comfortable during contractions  CVE: Dilation: 4 Effacement (%): 70 Station: -3 Presentation: Vertex Exam by:: Malva Cogan RN   Presentation cephalic verified by bedside US.  A&P: 21 y.o. G1P0 [redacted]w[redacted]d here for spontaneous ROM and labor management.  Will continue to monitor blood pressure closely given urine protein/creatinine ratio of 0.45.  Other preeclampsia labs negative. Will do cervical check when patient receives foley after epidural. Currently on 8 mill-units/min pitocin. Continue labor augmentation Anticipate SVD.  Lezlie Octave, MD [facility] Resident PGY-1 12/30/2016 4:05 PM

## 2016-12-30 NOTE — Progress Notes (Signed)
  LABOR PROGRESS NOTE  Lydia Petersen is a 21 y.o. G1P0 at [redacted]w[redacted]d  admitted for PROM at 1900 on 8/17.   Subjective: Discouraged by not much progress. Feels pressure. Has epidural, comfortable but can feel contractions slightly.  Objective: BP 121/70   Pulse 70   Temp 98.2 F (36.8 C) (Oral)   Resp 18   Ht 5\' 1"  (1.549 m)   Wt 78.5 kg (173 lb)   LMP 03/31/2016   SpO2 99%   BMI 32.69 kg/m  or  Vitals:   12/30/16 2000 12/30/16 2030 12/30/16 2100 12/30/16 2128  BP: 119/67 116/76 121/70   Pulse: 67 80 70   Resp:      Temp:    98.2 F (36.8 C)  TempSrc:    Oral  SpO2:      Weight:      Height:        Dilation: 6 Effacement (%): 80, 90 Station: +1 Presentation: Vertex Exam by:: Dr. Wonda Olds  FHT: baseline 145, accels present no decels Uterine activity: contractsion q3-4 min  Labs: Lab Results  Component Value Date   WBC 8.8 12/29/2016   HGB 13.0 12/29/2016   HCT 38.9 12/29/2016   MCV 87.4 12/29/2016   PLT 184 12/29/2016    Patient Active Problem List   Diagnosis Date Noted  . PROM (premature rupture of membranes) 12/29/2016  . Yeast infection 12/26/2016  . Supervision of normal first pregnancy, antepartum 11/30/2016    Assessment / Plan: 21 y.o. G1P0 at [redacted]w[redacted]d here for SROM  Labor: progressing on pitocin, IUPC placed at this cervical check Fetal Wellbeing:  Cat 1 tracing Pain Control:  epidural Anticipated MOD:  Vaginal delivery  Tillman Sers, DO PGY-2 8/18/20189:34 PM

## 2016-12-30 NOTE — Anesthesia Pain Management Evaluation Note (Signed)
  CRNA Pain Management Visit Note  Patient: Lydia Petersen, 21 y.o., female  "Hello I am a member of the anesthesia team at [facility]. We have an anesthesia team available at all times to provide care throughout the hospital, including epidural management and anesthesia for C-section. I don't know your plan for the delivery whether it a natural birth, water birth, IV sedation, nitrous supplementation, doula or epidural, but we want to meet your pain goals."   1.Was your pain managed to your expectations on prior hospitalizations?   Yes   2.What is your expectation for pain management during this hospitalization?     Epidural  3.How can we help you reach that goal? Give epidural when she wants it  Record the patient's initial score and the patient's pain goal.   Pain: 7  Pain Goal: 2 [facility] wants you to be able to say your pain was always managed very well.  Mauricia Area 12/30/2016

## 2016-12-30 NOTE — Anesthesia Procedure Notes (Signed)
Epidural Patient location during procedure: OB Start time: 12/30/2016 3:33 PM End time: 12/30/2016 4:00 PM  Staffing Anesthesiologist: Jairo Ben Performed: anesthesiologist   Preanesthetic Checklist Completed: patient identified, surgical consent, pre-op evaluation, timeout performed, IV checked, risks and benefits discussed and monitors and equipment checked  Epidural Patient position: sitting Prep: site prepped and draped and DuraPrep Patient monitoring: blood pressure, continuous pulse ox and heart rate Approach: midline Location: L3-L4 Injection technique: LOR air  Needle:  Needle type: Tuohy  Needle gauge: 17 G Needle length: 9 cm Needle insertion depth: 4 cm Catheter type: closed end flexible Catheter size: 19 Gauge Catheter at skin depth: 10 cm Test dose: negative (1% lidocaine)  Assessment Events: blood not aspirated, injection not painful, no injection resistance, negative IV test and no paresthesia  Additional Notes Pt identified in Labor room.  Monitors applied. Working IV access confirmed. Sterile prep, drape lumbar spine.  1% lido local L 3,4.  #17ga Touhy LOR air at 4 cm L 3,4, cath in easily to 10 cm skin. Test dose OK, cath dosed and infusion begun.  Patient asymptomatic, VSS, no heme aspirated, tolerated well.  Sandford Craze, MDReason for block:procedure for pain

## 2016-12-30 NOTE — Progress Notes (Signed)
Patient ID: Lydia Petersen, female   DOB: [birth year], 21 y.o.   MRN: [id number]  Not feeling ctx very close together; ready for augmentation  VSS, afeb FHR 130s, +accels, Cat 1 Ctx difficult to trace Cx deferred at present  IUP@term  PROM with forebag GBS neg  Will shower and have a light breakfast prior to Pit starting  Lydia Petersen CNM 12/30/2016 9:23 AM

## 2016-12-31 ENCOUNTER — Encounter (HOSPITAL_COMMUNITY): Payer: Self-pay | Admitting: *Deleted

## 2016-12-31 DIAGNOSIS — Z3A39 39 weeks gestation of pregnancy: Secondary | ICD-10-CM

## 2016-12-31 DIAGNOSIS — O4212 Full-term premature rupture of membranes, onset of labor more than 24 hours following rupture: Secondary | ICD-10-CM

## 2016-12-31 MED ORDER — ACETAMINOPHEN 325 MG PO TABS: 650.0000 mg | ORAL_TABLET | ORAL | Status: DC | PRN

## 2016-12-31 MED ORDER — DIBUCAINE 1 % RE OINT: 1.0000 "application " | TOPICAL_OINTMENT | RECTAL | Status: DC | PRN

## 2016-12-31 MED ORDER — MISOPROSTOL 200 MCG PO TABS
1000.0000 ug | ORAL_TABLET | Freq: Once | ORAL | Status: AC
  Administered 2016-12-31: 1000 ug via ORAL

## 2016-12-31 MED ORDER — SENNOSIDES-DOCUSATE SODIUM 8.6-50 MG PO TABS
2.0000 | ORAL_TABLET | ORAL | Status: DC
  Administered 2017-01-01: 2 via ORAL
  Filled 2016-12-31 (×2): qty 2

## 2016-12-31 MED ORDER — TETANUS-DIPHTH-ACELL PERTUSSIS 5-2.5-18.5 LF-MCG/0.5 IM SUSP: 0.5000 mL | Freq: Once | INTRAMUSCULAR | Status: DC

## 2016-12-31 MED ORDER — WITCH HAZEL-GLYCERIN EX PADS: 1.0000 "application " | MEDICATED_PAD | CUTANEOUS | Status: DC | PRN

## 2016-12-31 MED ORDER — OXYCODONE HCL 5 MG PO TABS: 10.0000 mg | ORAL_TABLET | ORAL | Status: DC | PRN

## 2016-12-31 MED ORDER — IBUPROFEN 600 MG PO TABS
600.0000 mg | ORAL_TABLET | Freq: Four times a day (QID) | ORAL | Status: DC
  Administered 2016-12-31 – 2017-01-02 (×9): 600 mg via ORAL
  Filled 2016-12-31 (×9): qty 1

## 2016-12-31 MED ORDER — BENZOCAINE-MENTHOL 20-0.5 % EX AERO
1.0000 "application " | INHALATION_SPRAY | CUTANEOUS | Status: DC | PRN
  Administered 2016-12-31: 1 via TOPICAL
  Filled 2016-12-31: qty 56

## 2016-12-31 MED ORDER — MISOPROSTOL 200 MCG PO TABS
ORAL_TABLET | ORAL | Status: AC
  Filled 2016-12-31: qty 5

## 2016-12-31 MED ORDER — ONDANSETRON HCL 4 MG PO TABS: 4.0000 mg | ORAL_TABLET | ORAL | Status: DC | PRN

## 2016-12-31 MED ORDER — ZOLPIDEM TARTRATE 5 MG PO TABS: 5.0000 mg | ORAL_TABLET | Freq: Every evening | ORAL | Status: DC | PRN

## 2016-12-31 MED ORDER — SIMETHICONE 80 MG PO CHEW: 80.0000 mg | CHEWABLE_TABLET | ORAL | Status: DC | PRN

## 2016-12-31 MED ORDER — PRENATAL MULTIVITAMIN CH
1.0000 | ORAL_TABLET | Freq: Every day | ORAL | Status: DC
  Administered 2016-12-31 – 2017-01-02 (×3): 1 via ORAL
  Filled 2016-12-31 (×3): qty 1

## 2016-12-31 MED ORDER — DIPHENHYDRAMINE HCL 25 MG PO CAPS: 25.0000 mg | ORAL_CAPSULE | Freq: Four times a day (QID) | ORAL | Status: DC | PRN

## 2016-12-31 MED ORDER — ONDANSETRON HCL 4 MG/2ML IJ SOLN: 4.0000 mg | INTRAMUSCULAR | Status: DC | PRN

## 2016-12-31 MED ORDER — COCONUT OIL OIL: 1.0000 "application " | TOPICAL_OIL | Status: DC | PRN

## 2016-12-31 MED ORDER — OXYCODONE HCL 5 MG PO TABS: 5.0000 mg | ORAL_TABLET | ORAL | Status: DC | PRN

## 2016-12-31 MED ORDER — METHYLERGONOVINE MALEATE 0.2 MG/ML IJ SOLN
0.2000 mg | Freq: Once | INTRAMUSCULAR | Status: AC
  Administered 2016-12-31: 0.2 mg via INTRAMUSCULAR

## 2016-12-31 NOTE — Progress Notes (Signed)
Labor Progress Note  Lydia Petersen is a 21 y.o. G1P0 at [redacted]w[redacted]d  admitted for PROM at 1900 on 12/29/2016.  Subjective: Attempted pushing for and decided to allow patient to labor down further. Increased pitocin to 20 miliunits/min. Patient comfortable with epidural.   Objective: Vitals:   12/31/16 0300 12/31/16 0330 12/31/16 0349 12/31/16 0400  BP: 119/67 121/66  135/85  Pulse: 84 87  (!) 106  Resp:      Temp:   98.3 F (36.8 C)   TempSrc:   Oral   SpO2:      Weight:      Height:       No intake/output data recorded.  FHT:  FHR: 155 bpm, variability: moderate,  accelerations:  Present,  decelerations: variable UC:   regular, every 2 minutes SVE:   Dilation: 10 Effacement (%): 100 Station: +2 Exam by:: Sandy Salaam, RN  Pitocin @ 18 mu/min  Labs: Lab Results  Component Value Date   WBC 8.8 12/29/2016   HGB 13.0 12/29/2016   HCT 38.9 12/29/2016   MCV 87.4 12/29/2016   PLT 184 12/29/2016    Assessment / Plan: IOL progressing well on pitocin.  Labor: Now complete, will reassess frequently and trial pushing Fetal Wellbeing:  Category II Pain Control:  Epidural Anticipated MOD:  NSVD  Dannette Barbara, Medical Student 12/31/2016, 4:38 AM  I confirm that I have verified the information documented in the student's note and that I have also personally reperformed the physical exam and all medical decision making activities.   Luna Kitchens CNM

## 2016-12-31 NOTE — Progress Notes (Signed)
   Lydia Petersen is a 21 y.o. G1P0 at [redacted]w[redacted]d  admitted for PROM at 5 on 12-29-2016.  Subjective: Patient comfortable with epidural.   Objective: Vitals:   12/30/16 2330 12/30/16 2351 12/31/16 0000 12/31/16 0004  BP: 117/76  117/77   Pulse: 74  79   Resp:      Temp:  98.3 F (36.8 C)  98.4 F (36.9 C)  TempSrc:  Oral  Axillary  SpO2:      Weight:      Height:       No intake/output data recorded.  FHT:  FHR: 140 bpm, variability: moderate,  accelerations:  Present,  decelerations:  Absent UC:   regular, every 2 minutes SVE:   Dilation: 6 Effacement (%): 80, 90 Station: +1 Exam by:: Dr. Wonda Olds  Pitocin @ 18 mu/min  Labs: Lab Results  Component Value Date   WBC 8.8 12/29/2016   HGB 13.0 12/29/2016   HCT 38.9 12/29/2016   MCV 87.4 12/29/2016   PLT 184 12/29/2016    Assessment / Plan: IOL progressing well on pitocin.  Labor: Progressing normally Fetal Wellbeing:  Category I Pain Control:  Epidural Anticipated MOD:  NSVD  Lydia Petersen 12/31/2016, 12:14 AM

## 2016-12-31 NOTE — Progress Notes (Signed)
Pt noticeably shivering

## 2016-12-31 NOTE — Anesthesia Postprocedure Evaluation (Signed)
Anesthesia Post Note  Patient: Lydia Petersen  Procedure(s) Performed: * No procedures listed *     Patient location during evaluation: Mother Baby Anesthesia Type: Epidural Level of consciousness: awake and alert and oriented Pain management: pain level controlled Vital Signs Assessment: post-procedure vital signs reviewed and stable Respiratory status: spontaneous breathing and nonlabored ventilation Cardiovascular status: stable Postop Assessment: no headache, patient able to bend at knees, no backache, no signs of nausea or vomiting, epidural receding and adequate PO intake Anesthetic complications: no    Last Vitals:  Vitals:   12/31/16 1027 12/31/16 1051  BP: 140/79 (!) 127/59  Pulse: (!) 115 92  Resp: 16 18  Temp:  (!) 38.4 C  SpO2:      Last Pain:  Vitals:   12/31/16 0850  TempSrc: Oral  PainSc:    Pain Goal:                 Laban Emperor

## 2016-12-31 NOTE — Lactation Note (Signed)
This note was copied from a baby's chart. Lactation Consultation Note  Patient Name: Lydia Petersen Date: 12/31/2016 Reason for consult: Initial assessment;1st time breastfeeding  Initial visit attempted at 8 hours of life. Mom has multiple visitors in room.  Mom says that infant is not nursing very well. Mom was educated about initial, sleepy newborn behavior. Mom inquired about breastfeeding & using formula for the first few days "if he is having problems" with eating. I asked that Mom give me a call & I'd return. Mom has my # to call for assist w/feedings, as desired.   Lurline Hare [facility] 12/31/2016, 4:45 PM

## 2016-12-31 NOTE — Progress Notes (Signed)
   Lydia Petersen is a 21 y.o. G1P0 at [redacted]w[redacted]d  admitted for induction of labor due to PROM.  Subjective: Patient breathing through contractions  Objective: Vitals:   12/31/16 0620 12/31/16 0630 12/31/16 0647 12/31/16 0700  BP:  106/61  105/66  Pulse:  87  87  Resp:      Temp: 99.2 F (37.3 C)  98.7 F (37.1 C)   TempSrc: Axillary  Oral   SpO2:      Weight:      Height:       No intake/output data recorded.  FHT:  FHR: 155 bpm, variability: moderate,  accelerations:  Present,  decelerations:  Absent UC:   irregular, every 2 minutes SVE:   Dilation: 10 Effacement (%): 100 Station: +2 Exam by:: Sandy Salaam, RN  Pitocin @ 22 mu/min  Labs: Lab Results  Component Value Date   WBC 8.8 12/29/2016   HGB 13.0 12/29/2016   HCT 38.9 12/29/2016   MCV 87.4 12/29/2016   PLT 184 12/29/2016    Assessment / Plan: Laboring down Will begin pushing again after 8 am.  Labor: laboring down Fetal Wellbeing:  Category I Pain Control:  Epidural Anticipated MOD:  NSVD  Charlesetta Garibaldi Denvil Canning 12/31/2016, 7:24 AM

## 2017-01-01 LAB — HEPATITIS B SURFACE ANTIGEN: Hepatitis B Surface Ag: NEGATIVE

## 2017-01-01 LAB — CBC
HCT: 30.7 % — ABNORMAL LOW (ref 36.0–46.0)
Hemoglobin: 10.3 g/dL — ABNORMAL LOW (ref 12.0–15.0)
MCH: 29.8 pg (ref 26.0–34.0)
MCHC: 33.6 g/dL (ref 30.0–36.0)
MCV: 88.7 fL (ref 78.0–100.0)
Platelets: 151 10*3/uL (ref 150–400)
RBC: 3.46 MIL/uL — AB (ref 3.87–5.11)
RDW: 14.7 % (ref 11.5–15.5)
WBC: 8.9 10*3/uL (ref 4.0–10.5)

## 2017-01-01 MED ORDER — IBUPROFEN 600 MG PO TABS: 600.0000 mg | ORAL_TABLET | Freq: Four times a day (QID) | ORAL | 0 refills | Status: DC

## 2017-01-01 NOTE — Plan of Care (Signed)
Problem: Nutritional: Goal: Mothers verbalization of comfort with breastfeeding process will improve Outcome: Progressing Encouraged to call for latch assistance/assessment. Asked LC to see pt.

## 2017-01-01 NOTE — Lactation Note (Signed)
This note was copied from a baby's chart. Lactation Consultation Note  Patient Name: Lydia Petersen [id number] Date: 01/01/2017 Reason for consult: Follow-up assessment    With this mom of a term baby, now 54 hours old. Mom had tried latching baby with shield, but said she felt she did not have enough milk, so bottle feed the baby 37m l's  On exam of mom's breasts, she has easily expressed colostrum, which mom saw. He nipple tips are red and tender. I reviewed HE with mom, applied EBM and comfort gels to her nipples.Mom instructed in use and care of gels.  Mom exhausted, to take a nap, and will call for questions/concerns. Mom encouraged to pump after her nap. I decreased mom to 21 flanges with a good fit.    Maternal Data Has patient been taught Hand Expression?: Yes  Feeding Feeding Type: Bottle Fed - Formula Nipple Type: Slow - flow Length of feed: 2 min  LATCH Score Latch:  (mom states she applied shield and attempted latching baby, but was not aable to express any milk)  Audible Swallowing: A few with stimulation  Type of Nipple: Flat  Comfort (Breast/Nipple): Soft / non-tender  Hold (Positioning): Assistance needed to correctly position infant at breast and maintain latch.  LATCH Score: 6  Interventions    Lactation Tools Discussed/Used Flange Size: 21 WIC Program: Yes (fax sent to see if mom could get a DEP)   Consult Status Consult Status: Follow-up Date: 01/02/17 Follow-up type: In-patient    Alfred Levins 01/01/2017, 2:23 PM

## 2017-01-01 NOTE — Lactation Note (Signed)
This note was copied from a baby's chart. Lactation Consultation Note Baby hasn't BF. Nasal congestion snorting. RN had given saline drops. Encouraged to BF in football position to keep head upright for easier breathing. Wiped green matter out of baby's eyes bilaterally. Reported to RN.  Hand expressed 1 ml colostrum. Round Breast tender. Nipples flat. RN had given #20 NS. Fitted #16 ns after hand expressing.   Baby wouldn't open mouth wide enough to get finger in mouth to stimulate to suck. Massage cheeks and jaw. Rubbed gums w/gloved finger and colostrum on glove to stimulate baby to open wide enough to get on breast. Even when baby cried wouldn't open wide enough to latch. Again tried to massage to open mouth. Finally baby opened more, has thick upper labial frenulum, Tight frenulum tongue, multi ridged gum lines w/sharp edges in the back, high palate. Baby suckled on gloved finger a few times after much stimulation and attempting to get baby to open mouth wider.  Latched baby finally after multiple attempts. Baby only BF total of 1 minute at intervals. Had good suckles. LC felt that d/t nasal congestion difficulty breathing w/breast in mouth.   D/t non compressible flat nipples, mom will have to use NS for now. Mom had shells in rm. But wasn't wearing in bra. Gave mom shells, discussed benefits of wearing them. Noted colostrum in NS just that few times baby suckled on breast. Mom has DEBP, encouraged mom to pump to give baby colostrum in spoon. Mom had asked for formula earlier. Discussed she has colostrum, encouraged to give that first, then formula if needed. Discussed baby hopefully will BF better soon. Needs to attempt to BF every 2-3 hours if hasn't cued. Needs to spoon feed baby colostrum to get baby to want to feed. Encouraged to alert RN if baby is cont. Not to latch.   Mom speaks good [place], states she understands feeding plan at this time.   Patient Name: Lydia Petersen [id number] Date: 01/01/2017 Reason for consult: Follow-up assessment;Difficult latch   Maternal Data    Feeding Feeding Type: Breast Milk Length of feed: 1 min  LATCH Score Latch: Too sleepy or reluctant, no latch achieved, no sucking elicited.  Audible Swallowing: None  Type of Nipple: Flat  Comfort (Breast/Nipple): Soft / non-tender  Hold (Positioning): Full assist, staff holds infant at breast  LATCH Score: 3  Interventions Interventions: Breast feeding basics reviewed;Support pillows;Assisted with latch;Position options;Skin to skin;Expressed milk;Breast massage;Hand express;Shells;Pre-pump if needed;Hand pump;Breast compression;Adjust position;DEBP  Lactation Tools Discussed/Used Tools: Shells;Pump;Nipple Shields Nipple shield size: 16;20 Shell Type: Inverted Breast pump type: Double-Electric Breast Pump;Manual   Consult Status Consult Status: Follow-up Date: 01/01/17 Follow-up type: In-patient    [name], Diamond Nickel 01/01/2017, 4:06 AM

## 2017-01-01 NOTE — Progress Notes (Signed)
Post Partum Day *3 Subjective: no complaints, up ad lib, voiding, tolerating PO and breastfeeding  Objective: Blood pressure (!) 108/56, pulse 71, temperature 97.6 F (36.4 C), temperature source Oral, resp. rate 18, height 5\' 1"  (1.549 m), weight 78.5 kg (173 lb), last menstrual period 03/31/2016, SpO2 98 %, unknown if currently breastfeeding.  Physical Exam:  General: alert Lochia: appropriate Uterine Fundus: firm and NT at U DVT Evaluation: No evidence of DVT seen on physical exam.   Recent Labs  12/29/16 2132 01/01/17 0501  HGB 13.0 10.3*  HCT 38.9 30.7*    Assessment/Plan: Plan for discharge tomorrow   LOS: 3 days   Quinlin Conant C Jaylanni Eltringham 01/01/2017, 7:10 AM

## 2017-01-01 NOTE — Lactation Note (Signed)
This note was copied from a baby's chart. Lactation Consultation Note  Patient Name: Lydia Petersen [id number] Date: 01/01/2017 Reason for consult: Follow-up assessment   With this first time mom and term baby, now 15 hors old. Mom is using nipple shield, and seeing coostrum in the shield. She asked fos formula, saying she is not sure her baby  Is getting enough to eat. I suggested mom call me for next feeding, so I can observe the latch, and assess mom's colostrum, and see if baby is transferring. I told mom then we would talk about adding formula. Mom agreed to this, and will call when baby next shows cues.    Maternal Data    Feeding Feeding Type: Breast Fed Length of feed: 15 min  LATCH Score Latch: Repeated attempts needed to sustain latch, nipple held in mouth throughout feeding, stimulation needed to elicit sucking reflex.  Audible Swallowing: A few with stimulation  Type of Nipple: Flat  Comfort (Breast/Nipple): Soft / non-tender  Hold (Positioning): Assistance needed to correctly position infant at breast and maintain latch.  LATCH Score: 6  Interventions    Lactation Tools Discussed/Used Nipple shield size: 16   Consult Status Consult Status: Follow-up Date: 01/01/17 Follow-up type: In-patient    Alfred Levins 01/01/2017, 12:58 PM

## 2017-01-01 NOTE — Plan of Care (Signed)
Problem: Pain Management: Goal: General experience of comfort will improve and pain level will decrease Outcome: Completed/Met Date Met: 01/01/17 Pain WNL

## 2017-01-01 NOTE — Progress Notes (Signed)
UR chart review completed.  

## 2017-01-01 NOTE — Clinical Social Work Maternal (Signed)
  CLINICAL SOCIAL WORK MATERNAL/CHILD NOTE  Patient Details  Name: Lydia Petersen MRN: [id number] Date of Birth: May 02, 1996  Date:  01/01/2017  Clinical Social Worker Initiating Note:  Laurey Arrow Date/ Time Initiated:  01/01/17/1046     Child's Name:  unknown   Legal Guardian:  Mother   Need for Interpreter:  None   Date of Referral:  12/31/16     Reason for Referral:  Late or No Prenatal Care    Referral Source:  Central Nursery   Address:  [place] [address]  Phone number:  [phone]   Household Members:  Significant Other, Parents, Self (MOB and FOB resides with MOB's parents.)   Natural Supports (not living in the home):  Immediate Family, Friends, Extended Family   Professional Supports: None   Employment: Unemployed   Type of Work:     Education:  Database administrator Resources:      Other Resources:  [facility]   Cultural/Religious Considerations Which May Impact Care:  None reported  Strengths:  Ability to meet basic needs , Home prepared for child    Risk Factors/Current Problems:  None   Cognitive State:  Able to Concentrate , Alert , Linear Thinking , Insightful    Mood/Affect:  Calm , Bright , Happy , Interested , Comfortable    CSW Assessment: CSW met with MOB to complete an assessment for late [facility].  When CSW arrived, MOB was resting in bed, FOB and MOB's mother were on the couch holding and observing infant.  MOB gave CSW permission to complete the assessments while MOB's guest were present. CSW utilize [employer] to assist with language barrier. CSW explained CSW's role and encouraged MOB to ask questions. CSW inquired about MOB's PNC prior to MOB moving to [place].  MOB reported that MOB initiated [place] around 10 weeks and [facility], has requested MOB PNC numerous times from [facility] in [place].  MOB reported consistent OB visits while in [place]. CSW explained the hospital's policy  regarding not having documentation regarding MOB's PNC; MOB was understood. MOB denied the use of all illicit substance and expressed the MOB was not concerned about infants screens. CSW made MOB aware that infant's UDS was negative and CSW will continue to monitor infant's CDS.  CSW informed MOB that if infant's CDS is positive without an explanation, CSW will make a report to [facility] CPS. MOB communicated MOB is prepared for the infant and has all needed items. CSW educated MOB about PPD and informed MOB of possible supports and interventions to decrease PPD.  CSW also encouraged MOB to seek medical attention if needed for increased signs and symptoms for PPD. MOB denied PPD with MOB's oldest child. CSW reviewed safe sleep and SIDS. MOB was knowledgeable.  MOB did not have any questions or concerns at this time, and CSW thanked MOB for allowing CSW to meet with MOB. CSW Plan/Description:  Information/Referral to [employer] , No Further Intervention Required/No Barriers to Discharge, Patient/Family Education  (CSW will monitor infant's CDS and will make a report if warranted. )   Laurey Arrow, MSW, LCSW Clinical Social Work [phone]   Dimple Nanas, LCSW 01/01/2017, 1:49 PM

## 2017-01-01 NOTE — Discharge Instructions (Signed)
Contraception Choices °Contraception, also called birth control, means things to use or ways to try not to get pregnant. °Hormonal birth control °This kind of birth control uses hormones. Here are some types of hormonal birth control: °· A tube that is put under skin of the arm (implant). The tube can stay in for as long as 3 years. °· Shots to get every 3 months (injections). °· Pills to take every day (birth control pills). °· A patch to change 1 time each week for 3 weeks (birth control patch). After that, the patch is taken off for 1 week. °· A ring to put in the vagina. The ring is left in for 3 weeks. Then it is taken out of the vagina for 1 week. Then a new ring is put in. °· Pills to take after unprotected sex (emergency birth control pills). ° °Barrier birth control °Here are some types of barrier birth control: °· A thin covering that is put on the penis before sex (female condom). The covering is thrown away after sex. °· A soft, loose covering that is put in the vagina before sex (female condom). The covering is thrown away after sex. °· A rubber bowl that sits over the cervix (diaphragm). The bowl must be made for you. The bowl is put into the vagina before sex. The bowl is left in for 6-8 hours after sex. It is taken out within 24 hours. °· A small, soft cup that fits over the cervix (cervical cap). The cup must be made for you. The cup can be left in for 6-8 hours after sex. It is taken out within 48 hours. °· A sponge that is put into the vagina before sex. It must be left in for at least 6 hours after sex. It must be taken out within 30 hours. Then it is thrown away. °· A chemical that kills or stops sperm from getting into the uterus (spermicide). It may be a pill, cream, jelly, or foam to put in the vagina. The chemical should be used at least 10-15 minutes before sex. ° °IUD (intrauterine) birth control °An IUD is a small, T-shaped piece of plastic. It is put inside the uterus. There are two  kinds: °· Hormone IUD. This kind can stay in for 3-5 years. °· Copper IUD. This kind can stay in for 10 years. ° °Permanent birth control °Here are some types of permanent birth control: °· Surgery to block the fallopian tubes. °· Having an insert put into each fallopian tube. °· Surgery to tie off the tubes that carry sperm (vasectomy). ° °Natural planning birth control °Here are some types of natural planning birth control: °· Not having sex on the days the woman could get pregnant. °· Using a calendar: °? To keep track of the length of each period. °? To find out what days pregnancy can happen. °? To plan to not have sex on days when pregnancy can happen. °· Watching for symptoms of ovulation and not having sex during ovulation. One way the woman can check for ovulation is to check her temperature. °· Waiting to have sex until after ovulation. ° °Summary °· Contraception, also called birth control, means things to use or ways to try not to get pregnant. °· Hormonal methods of birth control include implants, injections, pills, patches, vaginal rings, and emergency birth control pills. °· Barrier methods of birth control can include female condoms, female condoms, diaphragms, cervical caps, sponges, and spermicides. °· There are two types of   IUD (intrauterine device) birth control. An IUD can be put in a woman's uterus to prevent pregnancy for 3-5 years. °· Permanent sterilization can be done through a procedure for males, females, or both. °· Natural planning methods involve not having sex on the days when the woman could get pregnant. °This information is not intended to replace advice given to you by your health care provider. Make sure you discuss any questions you have with your health care provider. °Document Released: 02/26/2009 Document Revised: 05/11/2016 Document Reviewed: 05/11/2016 °Elsevier Interactive Patient Education © 2017 [employer] °Home Care Instructions for Mom °ACTIVITY °· Gradually return to  your regular activities. °· Let yourself rest. Nap while your baby sleeps. °· Avoid lifting anything that is heavier than 10 lb (4.5 kg) until your health care provider says it is okay. °· Avoid activities that take a lot of effort and energy (are strenuous) until approved by your health care provider. Walking at a slow-to-moderate pace is usually safe. °· If you had a cesarean delivery: °? Do not vacuum, climb stairs, or drive a car for 4-6 weeks. °? Have someone help you at home until you feel like you can do your usual activities yourself. °? Do exercises as told by your health care provider, if this applies. ° °VAGINAL BLEEDING °You may continue to bleed for 4-6 weeks after delivery. Over time, the amount of blood usually decreases and the color of the blood usually gets lighter. However, the flow of bright red blood may increase if you have been too active. If you need to use more than one pad in an hour because your pad gets soaked, or if you pass a large clot: °· Lie down. °· Raise your feet. °· Place a cold compress on your lower abdomen. °· Rest. °· Call your health care provider. ° °If you are breastfeeding, your period should return anytime between 8 weeks after delivery and the time that you stop breastfeeding. If you are not breastfeeding, your period should return 6-8 weeks after delivery. °PERINEAL CARE °The perineal area, or perineum, is the part of your body between your thighs. After delivery, this area needs special care. Follow these instructions as told by your health care provider. °· Take warm tub baths for 15-20 minutes. °· Use medicated pads and pain-relieving sprays and creams as told. °· Do not use tampons or douches until vaginal bleeding has stopped. °· Each time you go to the bathroom: °? Use a peri bottle. °? Change your pad. °? Use towelettes in place of toilet paper until your stitches have healed. °· Do Kegel exercises every day. Kegel exercises help to maintain the muscles that  support the vagina, bladder, and bowels. You can do these exercises while you are standing, sitting, or lying down. To do Kegel exercises: °? Tighten the muscles of your abdomen and the muscles that surround your birth canal. °? Hold for a few seconds. °? Relax. °? Repeat until you have done this 5 times in a row. °· To prevent hemorrhoids from developing or getting worse: °? Drink enough fluid to keep your urine clear or pale yellow. °? Avoid straining when having a bowel movement. °? Take over-the-counter medicines and stool softeners as told by your health care provider. ° °BREAST CARE °· Wear a tight-fitting bra. °· Avoid taking over-the-counter pain medicine for breast discomfort. °· Apply ice to the breasts to help with discomfort as needed: °? Put ice in a plastic bag. °? Place a towel between your   skin and the bag. °? Leave the ice on for 20 minutes or as told by your health care provider. ° °NUTRITION °· Eat a well-balanced diet. °· Do not try to lose weight quickly by cutting back on calories. °· Take your prenatal vitamins until your postpartum checkup or until your health care provider tells you to stop. ° °POSTPARTUM DEPRESSION °You may find yourself crying for no apparent reason and unable to cope with all of the changes that come with having a newborn. This mood is called postpartum depression. Postpartum depression happens because your hormone levels change after delivery. If you have postpartum depression, get support from your partner, friends, and family. If the depression does not go away on its own after several weeks, contact your health care provider. °BREAST SELF-EXAM °Do a breast self-exam each month, at the same time of the month. If you are breastfeeding, check your breasts just after a feeding, when your breasts are less full. If you are breastfeeding and your period has started, check your breasts on day 5, 6, or 7 of your period. °Report any lumps, bumps, or discharge to your health  care provider. Know that breasts are normally lumpy if you are breastfeeding. This is temporary, and it is not a health risk. °INTIMACY AND SEXUALITY °Avoid sexual activity for at least 3-4 weeks after delivery or until the brownish-red vaginal flow is completely gone. If you want to avoid pregnancy, use some form of birth control. You can get pregnant after delivery, even if you have not had your period. °SEEK MEDICAL CARE IF: °· You feel unable to cope with the changes that a child brings to your life, and these feelings do not go away after several weeks. °· You notice a lump, a bump, or discharge on your breast. ° °SEEK IMMEDIATE MEDICAL CARE IF: °· Blood soaks your pad in 1 hour or less. °· You have: °? Severe pain or cramping in your lower abdomen. °? A bad-smelling vaginal discharge. °? A fever that is not controlled by medicine. °? A fever, and an area of your breast is red and sore. °? Pain or redness in your calf. °? Sudden, severe chest pain. °? Shortness of breath. °? Painful or bloody urination. °? Problems with your vision. °· You vomit for 12 hours or longer. °· You develop a severe headache. °· You have serious thoughts about hurting yourself, your child, or anyone else. ° °This information is not intended to replace advice given to you by your health care provider. Make sure you discuss any questions you have with your health care provider. °Document Released: 04/28/2000 Document Revised: 10/07/2015 Document Reviewed: 11/02/2014 °Elsevier Interactive Patient Education © 2017 [employer] ° °

## 2017-01-02 NOTE — Discharge Summary (Signed)
OB Discharge Summary     Patient Name: Lydia Petersen DOB: 08/04/1995 MRN: [id number]  Date of admission: 12/29/2016 Delivering MD: Dannette Barbara   Date of discharge: 01/02/2017  Admitting diagnosis: 39WKS LEAKING FLUID Intrauterine pregnancy: [redacted]w[redacted]d     Secondary diagnosis:  Active Problems:   PROM (premature rupture of membranes)  Additional problems: none     Discharge diagnosis: Term Pregnancy Delivered                                                                                                Post partum procedures:none  Augmentation: Pitocin and Cytotec  Complications: manual extraction of placenta, postpartum fever  Hospital course:  Onset of Labor With Vaginal Delivery     21 y.o. yo G1P1001 at [redacted]w[redacted]d was admitted in Latent Labor on 12/29/2016. Patient had an uncomplicated labor course as follows:  Membrane Rupture Time/Date: 7:00 PM ,12/29/2016   Intrapartum Procedures: Episiotomy: None [1]                                         Lacerations:     Patient had a delivery of a Viable infant. 12/31/2016  Information for the patient's newborn:  Mikie, Battle Girl Karyme [[id number]]  Delivery Method: Vaginal, Spontaneous Delivery (Filed from Delivery Summary)    Pateint had an uncomplicated postpartum course.  She is ambulating, tolerating a regular diet, passing flatus, and urinating well. Patient is discharged home in stable condition on 01/02/17.   Physical exam  Vitals:   12/31/16 1740 01/01/17 0611 01/01/17 1900 01/02/17 0533  BP: 122/61 (!) 108/56 (!) 116/59 112/62  Pulse: 96 71 81 75  Resp: 18 18 20 18   Temp: 98.3 F (36.8 C) 97.6 F (36.4 C) 97.7 F (36.5 C) 98.5 F (36.9 C)  TempSrc: Oral Oral Oral Oral  SpO2: 98%  98%   Weight:      Height:       General: alert, cooperative and no distress Lochia: appropriate Uterine Fundus: firm Incision: N/A DVT Evaluation: No evidence of DVT seen on physical exam. No cords or calf  tenderness. No significant calf/ankle edema. Labs: Lab Results  Component Value Date   WBC 8.9 01/01/2017   HGB 10.3 (L) 01/01/2017   HCT 30.7 (L) 01/01/2017   MCV 88.7 01/01/2017   PLT 151 01/01/2017   CMP Latest Ref Rng & Units 12/29/2016  Glucose 65 - 99 mg/dL 93  BUN 6 - 20 mg/dL 18  Creatinine 6.28 - 3.66 mg/dL 2.94  Sodium 765 - 465 mmol/L 137  Potassium 3.5 - 5.1 mmol/L 3.9  Chloride 101 - 111 mmol/L 107  CO2 22 - 32 mmol/L 18(L)  Calcium 8.9 - 10.3 mg/dL 9.1  Total Protein 6.5 - 8.1 g/dL 6.4(L)  Total Bilirubin 0.3 - 1.2 mg/dL 0.5  Alkaline Phos 38 - 126 U/L 221(H)  AST 15 - 41 U/L 20  ALT 14 - 54 U/L 19    Discharge instruction: per After Visit Summary and "Baby and  Me Booklet".  After visit meds:  Allergies as of 01/02/2017   No Known Allergies     Medication List    TAKE these medications   ibuprofen 600 MG tablet Commonly known as:  ADVIL,MOTRIN Take 1 tablet (600 mg total) by mouth every 6 (six) hours.   prenatal multivitamin Tabs tablet Take 1 tablet by mouth daily at 12 noon.   terconazole 0.4 % vaginal cream Commonly known as:  TERAZOL 7 Place 1 applicator vaginally at bedtime.       Diet: routine diet  Activity: Advance as tolerated. Pelvic rest for 6 weeks.   Outpatient follow up:4-6 weeks Follow up Appt:Future Appointments Date Time Provider Department Center  02/14/2017 8:40 AM Dorathy Kinsman, [place] [facility]   Follow up Visit:No Follow-up on file.  Postpartum contraception: Undecided, will discuss with doctor at outpatient postpartum visit  Newborn Data: Live born female  Birth Weight: 8 lb 0.6 oz (3645 g) APGAR: 8, 9  Baby Feeding: Breast Disposition:home with mother   01/02/2017 Lennox Solders, MD  I confirm that I have verified the information documented in the resident's note and that I have also personally reperformed the physical exam and all medical decision making activities.  Patient was seen and examined by  me also Agree with note Vitals stable Labs stable Fundus firm, lochia within normal limits Perineum healing Ext WNL  Ready for discharge  Aviva Signs, CNM

## 2017-01-02 NOTE — Lactation Note (Addendum)
This note was copied from a baby's chart. Lactation Consultation Note  Patient Name: Lydia Petersen WEXHB'Z Date: 01/02/2017 Reason for consult: Follow-up assessment   P1, Baby 48 hours old.  Mother has been breast and formula.  She is using #16NS to latch. After MD exam, attempted latching without NS on L side.  After a few tries, baby latched without NS but fell asleep quickly. Baby was fed approx 2 hours ago. Mother's nipples are pink but states comfort gels have helped with soreness. Observed short anterior lingual frenulum that could be contributing to nipple soreness. Mother has both #16NS and #20NS.  Suggest trying half way through feeding without NS. Discussed supply and demand, waking techniques.  Mom encouraged to feed baby 8-12 times/24 hours and with feeding cues at least q 3 hours due to jaundice. Provided mother with extra NS.  Encouraged mother to post pump w/ hand pump 3-4 times a day for 10 min per side. Give volume pumped back to baby as long as she is jaundiced and using NS. Reviewed engorgement care, milk storage and monitoring voids/stools.      Maternal Data    Feeding Feeding Type: Breast Fed Nipple Type: Slow - flow Length of feed: 2 min (fell asleep)  LATCH Score Latch: Repeated attempts needed to sustain latch, nipple held in mouth throughout feeding, stimulation needed to elicit sucking reflex.  Audible Swallowing: A few with stimulation  Type of Nipple: Flat  Comfort (Breast/Nipple): Soft / non-tender  Hold (Positioning): Assistance needed to correctly position infant at breast and maintain latch.  LATCH Score: 6  Interventions    Lactation Tools Discussed/Used Tools: Nipple Shields Nipple shield size: 16 Pump Review: Milk Storage   Consult Status Consult Status: Follow-up Date: 01/03/17 Follow-up type: In-patient    Dahlia Byes [facility] 01/02/2017, 10:12 AM

## 2017-01-03 ENCOUNTER — Ambulatory Visit: Payer: Self-pay

## 2017-01-03 NOTE — Lactation Note (Signed)
This note was copied from a baby's chart. Lactation Consultation Note  Patient Name: Lydia Petersen [id number]'C Date: 01/03/2017 Reason for consult: Follow-up assessment;Nipple pain/trauma;Hyperbilirubinemia   Follow up with mom of 72 hour old infant. Infant under phototherapy currently. Infant with 8 formula feeds via bottle of 10-23 cc, Breast x 2 for 10-20 minutes, 4 BF attempts, 5 voids and 8 stools in last 24 hours. Infant weight 7 lb 10 oz with 5% weight loss since birth. LATCH score 6.   Mom reports she is not latching infant to the breast often as it is painful, she has been using a # 16 NS with feedings.  She reports she has not been pumping at all. Mom has DEBP set up in the room. Enc mom to pump every 3 hours after BF attempt to protect milk supply and to prevent engorgement. Engorgement prevention/treatment reviewed.Enc mom to given EBM prior to formula and discussed EBM helpful in assisting with the excretion of bilirubin. Mom does not have a pump at home. WIC referral faxed to [facility] office.   Spoke with Dr. Ave Filter to ask her to assess infant tongue with morning assessment. Enc mom to call out for feeding assessment/assistance with next feeding. Mom reports she has no questions/concerns at this time.     Maternal Data Formula Feeding for Exclusion: Yes Reason for exclusion: Mother's choice to formula and breast feed on admission Has patient been taught Hand Expression?: Yes Does the patient have breastfeeding experience prior to this delivery?: No  Feeding Nipple Type: Slow - flow  LATCH Score                   Interventions    Lactation Tools Discussed/Used WIC Program: Yes Pump Review: Setup, frequency, and cleaning;Milk Storage Initiated by:: Reviewed and encouraged every 3 hours   Consult Status Consult Status: Follow-up Date: 01/04/17 Follow-up type: In-patient    [name] 01/03/2017, 10:15 AM

## 2017-01-04 ENCOUNTER — Ambulatory Visit: Payer: Self-pay

## 2017-01-04 ENCOUNTER — Encounter: Payer: Self-pay | Admitting: Medical

## 2017-01-04 NOTE — Lactation Note (Signed)
This note was copied from a baby's chart. Lactation Consultation Note  Patient Name: Lydia Petersen [id number] Date: 01/04/2017  Parents walking out of room with baby for discharge.  Reviewed lactation outpatient services and support and encouraged prn.   Maternal Data    Feeding    LATCH Score                   Interventions    Lactation Tools Discussed/Used     Consult Status      Huston Foley 01/04/2017, 11:17 AM

## 2017-02-14 ENCOUNTER — Ambulatory Visit: Payer: Self-pay | Admitting: Advanced Practice Midwife

## 2017-02-14 ENCOUNTER — Encounter: Payer: Self-pay | Admitting: Family Medicine

## 2017-07-27 ENCOUNTER — Emergency Department (HOSPITAL_COMMUNITY): Payer: Self-pay

## 2017-07-27 ENCOUNTER — Encounter (HOSPITAL_COMMUNITY): Payer: Self-pay

## 2017-07-27 ENCOUNTER — Emergency Department (HOSPITAL_COMMUNITY)
Admission: EM | Admit: 2017-07-27 | Discharge: 2017-07-28 | Disposition: A | Discharge status: Home / Self Care | Payer: Self-pay | Attending: Emergency Medicine | Admitting: Emergency Medicine

## 2017-07-27 ENCOUNTER — Other Ambulatory Visit: Payer: Self-pay

## 2017-07-27 DIAGNOSIS — R0789 Other chest pain: Secondary | ICD-10-CM | POA: Insufficient documentation

## 2017-07-27 LAB — CBC
HCT: 42.4 % (ref 36.0–46.0)
Hemoglobin: 14 g/dL (ref 12.0–15.0)
MCH: 29 pg (ref 26.0–34.0)
MCHC: 33 g/dL (ref 30.0–36.0)
MCV: 87.8 fL (ref 78.0–100.0)
PLATELETS: 310 10*3/uL (ref 150–400)
RBC: 4.83 MIL/uL (ref 3.87–5.11)
RDW: 13.4 % (ref 11.5–15.5)
WBC: 13 10*3/uL — AB (ref 4.0–10.5)

## 2017-07-27 LAB — BASIC METABOLIC PANEL
Anion gap: 8 (ref 5–15)
BUN: 15 mg/dL (ref 6–20)
CALCIUM: 9.3 mg/dL (ref 8.9–10.3)
CHLORIDE: 107 mmol/L (ref 101–111)
CO2: 24 mmol/L (ref 22–32)
CREATININE: 0.83 mg/dL (ref 0.44–1.00)
Glucose, Bld: 116 mg/dL — ABNORMAL HIGH (ref 65–99)
Potassium: 4.1 mmol/L (ref 3.5–5.1)
SODIUM: 139 mmol/L (ref 135–145)

## 2017-07-27 LAB — I-STAT TROPONIN, ED: TROPONIN I, POC: 0 ng/mL (ref 0.00–0.08)

## 2017-07-27 LAB — I-STAT BETA HCG BLOOD, ED (MC, WL, AP ONLY): I-stat hCG, quantitative: <5 m[IU]/mL (ref <5)

## 2017-07-27 MED ORDER — KETOROLAC TROMETHAMINE 30 MG/ML IJ SOLN
15.0000 mg | Freq: Once | INTRAMUSCULAR | Status: AC
  Administered 2017-07-27: 15 mg via INTRAMUSCULAR
  Filled 2017-07-27: qty 1

## 2017-07-27 MED ORDER — NAPROXEN 500 MG PO TABS: 500.0000 mg | ORAL_TABLET | Freq: Two times a day (BID) | ORAL | 0 refills | Status: DC

## 2017-07-27 NOTE — ED Provider Notes (Signed)
[facility] EMERGENCY DEPARTMENT Provider Note   CSN: [id number] Arrival date & time: 07/27/17  2104     History   Chief Complaint Chief Complaint  Patient presents with  . Chest Pain    HPI Lydia Petersen is a 22 y.o. female.  Patient presents to the emergency department with complaint of left-sided chest pain for the past several days to week.  Pain became more generalized today.  She complains of pain that is waxing and waning without radiating.  She states that she felt short of breath today because it was tough to take a deep breath.  She also had some dizziness but no syncope.  She has not tried any medications for her pain.  She states that the pain is not made worse with eating or drinking.  Pain can occur at rest and with activity.  It is worse with movement.  She denies any acute injuries or repetitive motions but does have a small child at home whom she lifts up frequently.  No cough or fever.  No wheezing.  No history of high blood pressure, diabetes, high cholesterol, or smoking.  Father had a heart attack in his 35s. Patient denies risk factors for pulmonary embolism including: unilateral leg swelling, history of DVT/PE/other blood clots, use of exogenous hormones, recent immobilizations, recent surgery, recent travel (>4hr segment), malignancy, hemoptysis.        Past Medical History:  Diagnosis Date  . Medical history non-contributory     Patient Active Problem List   Diagnosis Date Noted  . PROM (premature rupture of membranes) 12/29/2016  . Yeast infection 12/26/2016  . Supervision of normal first pregnancy, antepartum 11/30/2016    Past Surgical History:  Procedure Laterality Date  . NO PAST SURGERIES      OB History    Gravida Para Term Preterm AB Living   1 1 1     1    SAB TAB Ectopic Multiple Live Births         0 1       Home Medications    Prior to Admission medications   Medication Sig Start Date End Date  Taking? Authorizing Provider  ibuprofen (ADVIL,MOTRIN) 600 MG tablet Take 1 tablet (600 mg total) by mouth every 6 (six) hours. 01/01/17   Allie Bossier, MD  naproxen (NAPROSYN) 500 MG tablet Take 1 tablet (500 mg total) by mouth 2 (two) times daily. 07/27/17   Renne Crigler, PA-C  Prenatal Vit-Fe Fumarate-FA (PRENATAL MULTIVITAMIN) TABS tablet Take 1 tablet by mouth daily at 12 noon.    [provider]  terconazole (TERAZOL 7) 0.4 % vaginal cream Place 1 applicator vaginally at bedtime. 12/26/16   Rasch, Harolyn Rutherford, NP    Family History History reviewed. No pertinent family history.  Social History Social History   Tobacco Use  . Smoking status: Never Smoker  . Smokeless tobacco: Never Used  Substance Use Topics  . Alcohol use: No  . Drug use: No     Allergies   Patient has no known allergies.   Review of Systems Review of Systems  Constitutional: Negative for diaphoresis and fever.  HENT: Negative for congestion.   Eyes: Negative for redness.  Respiratory: Positive for shortness of breath. Negative for cough.   Cardiovascular: Positive for chest pain. Negative for palpitations and leg swelling.  Gastrointestinal: Negative for abdominal pain, nausea and vomiting.  Genitourinary: Negative for dysuria.  Musculoskeletal: Negative for back pain and neck pain.  Skin:  Negative for rash.  Neurological: Positive for dizziness. Negative for syncope and light-headedness.  Psychiatric/Behavioral: The patient is not nervous/anxious.      Physical Exam Updated Vital Signs BP 113/76   Pulse 93   Temp 98.3 F (36.8 C) (Oral)   Resp 19   Ht 5\' 1"  (1.549 m)   Wt 70.3 kg (155 lb)   SpO2 99%   BMI 29.29 kg/m   Physical Exam  Constitutional: She appears well-developed and well-nourished.  HENT:  Head: Normocephalic and atraumatic.  Mouth/Throat: Mucous membranes are normal. Mucous membranes are not dry.  Eyes: Conjunctivae are normal.  Neck: Trachea normal and normal  range of motion. Neck supple. Normal carotid pulses and no JVD present. No muscular tenderness present. Carotid bruit is not present. No tracheal deviation present.  Cardiovascular: Normal rate, regular rhythm, S1 normal, S2 normal, normal heart sounds and intact distal pulses. Exam reveals no decreased pulses.  No murmur heard. Pulmonary/Chest: Effort normal. No respiratory distress. She has no wheezes. She exhibits tenderness (Moderate tenderness with palpation along the left sternal border, patient winces.).  Patient is able to, but has some difficulty lifting her arms above her head and putting her arms behind her back because of pain in her chest that is created when she does this.  Abdominal: Soft. Normal aorta and bowel sounds are normal. There is no tenderness. There is no rebound and no guarding.  Musculoskeletal: Normal range of motion.  Neurological: She is alert.  Skin: Skin is warm and dry. She is not diaphoretic. No cyanosis. No pallor.  Psychiatric: She has a normal mood and affect.  Nursing note and vitals reviewed.    ED Treatments / Results  Labs (all labs ordered are listed, but only abnormal results are displayed) Labs Reviewed  BASIC METABOLIC PANEL - Abnormal; Notable for the following components:      Result Value   Glucose, Bld 116 (*)    All other components within normal limits  CBC - Abnormal; Notable for the following components:   WBC 13.0 (*)    All other components within normal limits  I-STAT TROPONIN, ED  I-STAT BETA HCG BLOOD, ED ([facility], [facility], [facility] ONLY)    ED ECG REPORT   Date: 07/27/2017  Rate: 98  Rhythm: normal sinus rhythm  QRS Axis: normal  Intervals: normal  ST/T Wave abnormalities: normal  Conduction Disutrbances:none  Narrative Interpretation:   Old EKG Reviewed: none available  I have personally reviewed the EKG tracing and agree with the computerized printout as noted.  Radiology Dg Chest 2 View  Result Date: 07/27/2017 CLINICAL  DATA:  Initial evaluation for acute left-sided chest pain. EXAM: CHEST - 2 VIEW COMPARISON:  None. FINDINGS: The cardiac and mediastinal silhouettes are within normal limits. The lungs are normally inflated. Minimal left basilar subsegmental atelectasis. No airspace consolidation, pleural effusion, or pulmonary edema is identified. There is no pneumothorax. No acute osseous abnormality identified. are unremarkable. IMPRESSION: 1. Minimal left basilar subsegmental atelectasis. 2. No other active cardiopulmonary disease. Electronically Signed   By: Rise MuBenjamin  McClintock M.D.   On: 07/27/2017 21:43    Procedures Procedures (including critical care time)  Medications Ordered in ED Medications  ketorolac (TORADOL) 30 MG/ML injection 15 mg (15 mg Intramuscular Given 07/27/17 2340)     Initial Impression / Assessment and Plan / ED Course  I have reviewed the triage vital signs and the nursing notes.  Pertinent labs & imaging results that were available during my care of the  patient were reviewed by me and considered in my medical decision making (see chart for details).     Patient seen and examined.  Reviewed workup with patient.  She has readily reproducible chest wall tenderness.  Will treat with Toradol.  Discussed discharged home with anti-inflammatories.  Encourage PCP follow-up if symptoms are not improved in 1 week of this treatment.  Discussed rest as well.  Vital signs reviewed and are as follows: BP 113/76   Pulse 93   Temp 98.3 F (36.8 C) (Oral)   Resp 19   Ht 5\' 1"  (1.549 m)   Wt 70.3 kg (155 lb)   SpO2 99%   BMI 29.29 kg/m     Final Clinical Impressions(s) / ED Diagnoses   Final diagnoses:  Chest wall pain   Patient with chest pain, PERC negative.  Chest pain is readily reproducible to palpation and with movement.  EKG is normal.  Troponin negative x1.  Chest x-ray is clear without signs of infection.  I do not suspect ACS in this patient.  She appears well, nontoxic.   Will treat with anti-inflammatories and outpatient follow-up as needed.   ED Discharge Orders        Ordered    naproxen (NAPROSYN) 500 MG tablet  2 times daily     07/27/17 2333       Renne Crigler, PA-C 07/27/17 2349    Glynn Octave, MD 07/28/17 [id number]

## 2017-07-27 NOTE — Discharge Instructions (Signed)
Please read and follow all provided instructions.  Your diagnoses today include:  1. Chest wall pain    Tests performed today include:  An EKG of your heart  A chest x-ray  Cardiac enzymes - a blood test for heart muscle damage  Blood counts and electrolytes  Vital signs. See below for your results today.   Medications prescribed:   Naproxen - anti-inflammatory pain medication  Do not exceed 500mg  naproxen every 12 hours, take with food  You have been prescribed an anti-inflammatory medication or NSAID. Take with food. Take smallest effective dose for the shortest duration needed for your pain. Stop taking if you experience stomach pain or vomiting.   Take any prescribed medications only as directed.  Follow-up instructions: Please follow-up with your primary care provider as soon as you can for further evaluation of your symptoms.   Return instructions:  SEEK IMMEDIATE MEDICAL ATTENTION IF:  You have severe chest pain, especially if the pain is crushing or pressure-like and spreads to the arms, back, neck, or jaw, or if you have sweating, nausea (feeling sick to your stomach), or shortness of breath. THIS IS AN EMERGENCY. Don't wait to see if the pain will go away. Get medical help at once. Call 911 or 0 (operator). DO NOT drive yourself to the hospital.   Your chest pain gets worse and does not go away with rest.   You have an attack of chest pain lasting longer than usual, despite rest and treatment with the medications your caregiver has prescribed.   You wake from sleep with chest pain or shortness of breath.  You feel dizzy or faint.  You have chest pain not typical of your usual pain for which you originally saw your caregiver.   You have any other emergent concerns regarding your health.  Additional Information: Chest pain comes from many different causes. Your caregiver has diagnosed you as having chest pain that is not specific for one problem, but does not  require admission.  You are at low risk for an acute heart condition or other serious illness.   Your vital signs today were: BP 113/76    Pulse 93    Temp 98.3 F (36.8 C) (Oral)    Resp 19    Ht 5\' 1"  (1.549 m)    Wt 70.3 kg (155 lb)    SpO2 99%    BMI 29.29 kg/m  If your blood pressure (BP) was elevated above 135/85 this visit, please have this repeated by your doctor within one month. --------------

## 2017-07-27 NOTE — ED Triage Notes (Signed)
Patient coming from home for chest pain for a few days.  Pain got worse while sitting tonight.  Pain in generalized all over chest does not radiate.  A&Ox4

## 2020-07-22 ENCOUNTER — Other Ambulatory Visit: Payer: Self-pay

## 2020-07-22 ENCOUNTER — Ambulatory Visit: Payer: Self-pay | Admitting: Physician Assistant

## 2020-07-22 VITALS — BP 124/84 | HR 81 | Temp 98.5°F | Resp 18 | Ht 61.0 in | Wt 173.0 lb

## 2020-07-22 DIAGNOSIS — S0992XA Unspecified injury of nose, initial encounter: Secondary | ICD-10-CM

## 2020-07-22 DIAGNOSIS — R519 Headache, unspecified: Secondary | ICD-10-CM

## 2020-07-22 DIAGNOSIS — R42 Dizziness and giddiness: Secondary | ICD-10-CM

## 2020-07-22 MED ORDER — IBUPROFEN 800 MG PO TABS: 800.0000 mg | ORAL_TABLET | Freq: Three times a day (TID) | ORAL | 0 refills | Status: DC | PRN

## 2020-07-22 NOTE — Progress Notes (Signed)
Patient has not taken medication today and patient has not eaten today. Patient complains of HA's since experiencing fall on Sunday while playing with her daughter.

## 2020-07-22 NOTE — Progress Notes (Signed)
New Patient Office Visit  Subjective:  Patient ID: Lydia Petersen, female    DOB: 13-May-1996  Age: 25 y.o. MRN: [id number]  CC:  Chief Complaint  Patient presents with  . Facial Injury    Fall sunday    HPI [name] reports that she fell on Sunday, March 6 while playing with her daughter.  Reports that she has been having frontal and temporal headaches, and pain from her top 2 teeth when she chews.  Reports that the swelling has started to resolve.  Denies loss of consciousness, states she did not have any nose bleeding other than some blood-tinged mucus.  Is able to breathe through both nostrils, no change in vision.  Has taken Advil 400 mg and use ice with minimal relief.  Reports that she has been experiencing episodes of dizziness on standing for several months.  Has not tried anything for relief, no previous history of diabetes or anemia.    Past Medical History:  Diagnosis Date  . Medical history non-contributory     Past Surgical History:  Procedure Laterality Date  . NO PAST SURGERIES      History reviewed. No pertinent family history.  Social History   Socioeconomic History  . Marital status: Single    Spouse name: Not on file  . Number of children: Not on file  . Years of education: Not on file  . Highest education level: Not on file  Occupational History  . Not on file  Tobacco Use  . Smoking status: Never Smoker  . Smokeless tobacco: Never Used  Substance and Sexual Activity  . Alcohol use: No  . Drug use: No  . Sexual activity: Yes  Other Topics Concern  . Not on file  Social History Narrative  . Not on file   Social Determinants of Health   Financial Resource Strain: Not on file  Food Insecurity: Not on file  Transportation Needs: Not on file  Physical Activity: Not on file  Stress: Not on file  Social Connections: Not on file  Intimate Partner Violence: Not on file    ROS Review of Systems  Constitutional:  Negative.   HENT: Negative for nosebleeds.   Eyes: Negative for visual disturbance.  Respiratory: Negative.   Cardiovascular: Negative.   Gastrointestinal: Negative.   Endocrine: Negative.   Genitourinary: Negative.   Musculoskeletal: Negative.   Skin: Positive for color change and wound.  Allergic/Immunologic: Negative.   Neurological: Positive for dizziness and headaches. Negative for syncope, speech difficulty and weakness.  Hematological: Negative.   Psychiatric/Behavioral: Negative.     Objective:   Today's Vitals: BP 124/84 (BP Location: Left Arm, Patient Position: Sitting, Cuff Size: Normal)   Pulse 81   Temp 98.5 F (36.9 C) (Oral)   Resp 18   Ht 5\' 1"  (1.549 m)   Wt 173 lb (78.5 kg)   SpO2 100%   BMI 32.69 kg/m   Physical Exam Vitals and nursing note reviewed.  Constitutional:      Appearance: Normal appearance.  HENT:     Head: Contusion, right periorbital erythema and left periorbital erythema present.      Right Ear: External ear normal.     Left Ear: External ear normal.     Nose: Nasal tenderness present.     Mouth/Throat:     Mouth: Mucous membranes are moist.     Pharynx: Oropharynx is clear.  Eyes:     Extraocular Movements: Extraocular movements intact.  Conjunctiva/sclera: Conjunctivae normal.     Pupils: Pupils are equal, round, and reactive to light.  Cardiovascular:     Rate and Rhythm: Normal rate and regular rhythm.     Pulses: Normal pulses.     Heart sounds: Normal heart sounds.  Pulmonary:     Effort: Pulmonary effort is normal.     Breath sounds: Normal breath sounds.  Musculoskeletal:        General: Normal range of motion.     Cervical back: Normal range of motion and neck supple.  Skin:    General: Skin is warm and dry.  Neurological:     General: No focal deficit present.     Mental Status: She is alert and oriented to person, place, and time.     Cranial Nerves: Cranial nerves are intact.  Psychiatric:        Mood and  Affect: Mood normal.        Behavior: Behavior normal.        Thought Content: Thought content normal.        Judgment: Judgment normal.     Assessment & Plan:   Problem List Items Addressed This Visit   None   Visit Diagnoses    Injury of nose, initial encounter    -  Primary   Relevant Medications   ibuprofen (ADVIL) 800 MG tablet   Acute intractable headache, unspecified headache type       Relevant Medications   ibuprofen (ADVIL) 800 MG tablet   Other Relevant Orders   TSH (Completed)   Dizziness and giddiness       Relevant Orders   TSH (Completed)   CBC with Differential/Platelet (Completed)   Comp. Metabolic Panel (12) (Completed)      Outpatient Encounter Medications as of 07/22/2020  Medication Sig  . ibuprofen (ADVIL) 800 MG tablet Take 1 tablet (800 mg total) by mouth every 8 (eight) hours as needed.  . [DISCONTINUED] ibuprofen (ADVIL,MOTRIN) 600 MG tablet Take 1 tablet (600 mg total) by mouth every 6 (six) hours.  . [DISCONTINUED] naproxen (NAPROSYN) 500 MG tablet Take 1 tablet (500 mg total) by mouth 2 (two) times daily.  . [DISCONTINUED] Prenatal Vit-Fe Fumarate-FA (PRENATAL MULTIVITAMIN) TABS tablet Take 1 tablet by mouth daily at 12 noon.  . [DISCONTINUED] terconazole (TERAZOL 7) 0.4 % vaginal cream Place 1 applicator vaginally at bedtime.   No facility-administered encounter medications on file as of 07/22/2020.  1. Injury of nose, initial encounter Patient [facility], states that she was unable to be there at that time, did schedule her an appointment for the 1 week at 9:30 AM.  Does not have health insurance, patient was given financial assistance information.  Trial ibuprofen 800 mg, continue icing.  Red flags given for prompt reevaluation - ibuprofen (ADVIL) 800 MG tablet; Take 1 tablet (800 mg total) by mouth every 8 (eight) hours as needed.  Dispense: 30  tablet; Refill: 0  2. Acute intractable headache, unspecified headache type  - ibuprofen (ADVIL) 800 MG tablet; Take 1 tablet (800 mg total) by mouth every 8 (eight) hours as needed.  Dispense: 30 tablet; Refill: 0 - TSH  3. Dizziness and giddiness Patient is fasting.  Encourage patient to increase hydration, keep a written diary of dizziness episodes. - TSH - CBC with Differential/Platelet - Comp. Metabolic Panel (12)   I have reviewed the patient's medical history (PMH, PSH, Social History,  Family History, Medications, and allergies) , and have been updated if relevant. I spent 30 minutes reviewing chart and  face to face time with patient.    Follow-up: Return if symptoms worsen or fail to improve.   Kasandra Knudsen Mayers, PA-C

## 2020-07-22 NOTE — Patient Instructions (Signed)
I encourage you to use ibuprofen 800 mg every 8 hours for the next few days to help with the inflammation and headaches.  Make sure that you are staying very well-hydrated.  We have scheduled an appointment for you to be [facility] Friday, March 18 at 9:30 AM.  We will call you with your lab results.  Roney Jaffe, PA-C Physician Assistant [facility] [email or web address]  Dizziness Dizziness is a common problem. It is a feeling of unsteadiness or light-headedness. You may feel like you are about to faint. Dizziness can lead to injury if you stumble or fall. Anyone can become dizzy, but dizziness is more common in older adults. This condition can be caused by a number of things, including medicines, dehydration, or illness. Follow these instructions at home: Eating and drinking  Drink enough fluid to keep your urine clear or pale yellow. This helps to keep you from becoming dehydrated. Try to drink more clear fluids, such as water.  Do not drink alcohol.  Limit your caffeine intake if told to do so by your health care provider. Check ingredients and nutrition facts to see if a food or beverage contains caffeine.  Limit your salt (sodium) intake if told to do so by your health care provider. Check ingredients and nutrition facts to see if a food or beverage contains sodium. Activity  Avoid making quick movements. ? Rise slowly from chairs and steady yourself until you feel okay. ? In the morning, first sit up on the side of the bed. When you feel okay, stand slowly while you hold onto something until you know that your balance is fine.  If you need to stand in one place for a long time, move your legs often. Tighten and relax the muscles in your legs while you are standing.  Do not drive or use heavy machinery if you feel dizzy.  Avoid bending down if you feel dizzy.  Place items in your home so that they are easy for you to reach without leaning over. Lifestyle  Do not use any products that contain nicotine or tobacco, such as cigarettes and e-cigarettes. If you need help quitting, ask your health care provider.  Try to reduce your stress level by using methods such as yoga or meditation. Talk with your health care provider if you need help to manage your stress. General instructions  Watch your dizziness for any changes.  Take over-the-counter and prescription medicines only as told by your health care provider. Talk with your health care provider if you think that your dizziness is caused by a medicine that you are taking.  Tell a friend or a family member that you are feeling dizzy. If he or she notices any changes in your behavior, have this person call your health care provider.  Keep all follow-up visits as told by your health care provider. This is important. Contact a health care provider if:  Your dizziness does not go away.  Your dizziness or light-headedness gets worse.  You feel nauseous.  You have reduced hearing.  You have new symptoms.  You are unsteady on your feet or you feel like the room is spinning. Get help right away if:  You vomit or have diarrhea and are unable to eat or drink anything.  You have problems talking, walking, swallowing, or using your arms, hands, or legs.  You feel generally weak.  You are not thinking clearly or  you have trouble forming sentences. It may take a friend or family member to notice this.  You have chest pain, abdominal pain, shortness of breath, or sweating.  Your vision changes.  You have any bleeding.  You have a severe headache.  You have neck pain or a stiff neck.  You have a fever. These symptoms may represent a serious problem that is an emergency. Do not wait to see if the symptoms will go away. Get medical help right away. Call your local emergency services (911 in the  [place] Do not drive yourself to the hospital. Summary  Dizziness is a feeling of unsteadiness or light-headedness. This condition can be caused by a number of things, including medicines, dehydration, or illness.  Anyone can become dizzy, but dizziness is more common in older adults.  Drink enough fluid to keep your urine clear or pale yellow. Do not drink alcohol.  Avoid making quick movements if you feel dizzy. Monitor your dizziness for any changes. This information is not intended to replace advice given to you by your health care provider. Make sure you discuss any questions you have with your health care provider. Document Revised: 05/04/2017 Document Reviewed: 06/03/2016 Elsevier Patient Education  2021 [employer].

## 2020-07-23 LAB — CBC WITH DIFFERENTIAL/PLATELET
Basophils Absolute: 0 10*3/uL (ref 0.0–0.2)
Basos: 1 %
EOS (ABSOLUTE): 0.1 10*3/uL (ref 0.0–0.4)
Eos: 1 %
Hematocrit: 45.2 % (ref 34.0–46.6)
Hemoglobin: 15.6 g/dL (ref 11.1–15.9)
Immature Grans (Abs): 0 10*3/uL (ref 0.0–0.1)
Immature Granulocytes: 0 %
Lymphocytes Absolute: 1.8 10*3/uL (ref 0.7–3.1)
Lymphs: 29 %
MCH: 30.5 pg (ref 26.6–33.0)
MCHC: 34.5 g/dL (ref 31.5–35.7)
MCV: 89 fL (ref 79–97)
Monocytes Absolute: 0.2 10*3/uL (ref 0.1–0.9)
Monocytes: 3 %
Neutrophils Absolute: 4 10*3/uL (ref 1.4–7.0)
Neutrophils: 66 %
Platelets: 272 10*3/uL (ref 150–450)
RBC: 5.11 x10E6/uL (ref 3.77–5.28)
RDW: 12.3 % (ref 11.7–15.4)
WBC: 6.1 10*3/uL (ref 3.4–10.8)

## 2020-07-23 LAB — COMP. METABOLIC PANEL (12)
AST: 35 IU/L (ref 0–40)
Albumin/Globulin Ratio: 1.9 (ref 1.2–2.2)
Albumin: 5.2 g/dL — ABNORMAL HIGH (ref 3.9–5.0)
Alkaline Phosphatase: 101 IU/L (ref 44–121)
BUN/Creatinine Ratio: 20 (ref 9–23)
BUN: 13 mg/dL (ref 6–20)
Bilirubin Total: 0.5 mg/dL (ref 0.0–1.2)
Calcium: 9.7 mg/dL (ref 8.7–10.2)
Chloride: 101 mmol/L (ref 96–106)
Creatinine, Ser: 0.66 mg/dL (ref 0.57–1.00)
Globulin, Total: 2.8 g/dL (ref 1.5–4.5)
Glucose: 93 mg/dL (ref 65–99)
Potassium: 4.1 mmol/L (ref 3.5–5.2)
Sodium: 140 mmol/L (ref 134–144)
Total Protein: 8 g/dL (ref 6.0–8.5)
eGFR: 126 mL/min/{1.73_m2} (ref >59)

## 2020-07-23 LAB — TSH: TSH: 3.19 u[IU]/mL (ref 0.450–4.500)

## 2020-07-24 DIAGNOSIS — R42 Dizziness and giddiness: Secondary | ICD-10-CM | POA: Insufficient documentation

## 2020-07-24 DIAGNOSIS — R519 Headache, unspecified: Secondary | ICD-10-CM | POA: Insufficient documentation

## 2020-07-24 DIAGNOSIS — S0992XA Unspecified injury of nose, initial encounter: Secondary | ICD-10-CM | POA: Insufficient documentation

## 2020-07-26 ENCOUNTER — Telehealth: Payer: Self-pay | Admitting: *Deleted

## 2020-07-26 NOTE — Telephone Encounter (Signed)
-----   Message from Roney Jaffe, [place] sent at 07/24/2020  8:13 PM EST ----- Please call patient and let her know that her labs completed do not show any reason for her episodes of dizziness.  Her thyroid function, kidney function and liver function are all within normal limits, she does not show signs of anemia.

## 2020-07-26 NOTE — Telephone Encounter (Signed)
Medical Assistant used [employer] to contact patient.  Interpreter Name: Marijean Heath #: [id number] Patient was not available, [employer] left patient a voicemail. Voicemail states to give a call back to [place] with [facility] at [phone]

## 2020-07-30 ENCOUNTER — Ambulatory Visit: Payer: Self-pay | Admitting: Otolaryngology

## 2021-04-25 ENCOUNTER — Other Ambulatory Visit: Payer: Self-pay

## 2021-04-25 DIAGNOSIS — N644 Mastodynia: Secondary | ICD-10-CM

## 2021-05-17 ENCOUNTER — Ambulatory Visit: Payer: Self-pay | Admitting: *Deleted

## 2021-05-17 ENCOUNTER — Ambulatory Visit
Admission: RE | Admit: 2021-05-17 | Discharge: 2021-05-17 | Disposition: A | Discharge status: Home / Self Care | Payer: No Typology Code available for payment source | Source: Ambulatory Visit | Attending: Obstetrics and Gynecology | Admitting: Obstetrics and Gynecology

## 2021-05-17 ENCOUNTER — Other Ambulatory Visit: Payer: Self-pay

## 2021-05-17 VITALS — BP 124/76 | Wt 164.9 lb

## 2021-05-17 DIAGNOSIS — N644 Mastodynia: Secondary | ICD-10-CM

## 2021-05-17 DIAGNOSIS — N6315 Unspecified lump in the right breast, overlapping quadrants: Secondary | ICD-10-CM

## 2021-05-17 DIAGNOSIS — Z01419 Encounter for gynecological examination (general) (routine) without abnormal findings: Secondary | ICD-10-CM

## 2021-05-17 NOTE — Progress Notes (Addendum)
Ms. Lydia Petersen is a 26 y.o. [id number] female who presents to [facility] clinic today with complaint of right breast bump next to nipple and pain x one month. Patient stated the pain comes and goes. Patient stated the pain extends from her nipple and radiates to the axilla. Patient rates the pain at a 5 out of 10..    Pap Smear: Pap smear completed today. Last Pap smear was over 3 years ago at a clinic on [facility] in [place] and was normal per patient. Per patient has no history of an abnormal Pap smear. Last Pap smear result is not available in Epic.   Physical exam: Breasts Left breast larger than right breat that per patient is normal for her. No skin abnormalities bilateral breasts. No nipple retraction bilateral breasts. No nipple discharge bilateral breasts. No lymphadenopathy. No lumps palpated left breast. Palpated a bb sized lump within the right breast at 9 o'clock next to the nipple. Complaints of right outer breast pain on exam.    Pelvic/Bimanual Ext Genitalia No lesions, no swelling and no discharge observed on external genitalia.        Vagina Vagina pink and normal texture. No lesions or discharge observed in vagina.        Cervix Cervix is present. Cervix pink and of normal texture. Cervix friable. Cervical mucous observed at os.   Uterus Uterus is present and palpable. Uterus in normal position and normal size.        Adnexae Bilateral ovaries present and palpable. No tenderness on palpation.         Rectovaginal No rectal exam completed today since patient had no rectal complaints. No skin abnormalities observed on exam.     Smoking History: Patient has never smoked.   Patient Navigation: Patient education provided. Access to services provided for patient through [facility] program.    Breast and Cervical Cancer Risk Assessment: Patient does not have family history of breast cancer, known genetic mutations, or radiation treatment to the chest before  age 75. Patient does not have history of cervical dysplasia, immunocompromised, or DES exposure in-utero. Breast cancer risk assessment completed. No breast cancer risk calculated due to patient is less than 41 years old.  Risk Assessment     Risk Scores       05/17/2021   Last edited by: Narda Rutherford, LPN   5-year risk:    Lifetime risk:             A: [facility] exam with pap smear Complaint of right breast pain and bump.  P: Referred patient to the [facility] for a right breast ultrasound. Appointment scheduled Tuesday, May 17, 2021 at 0950.  Priscille Heidelberg, RN 05/17/2021 8:43 AM

## 2021-05-17 NOTE — Patient Instructions (Signed)
Explained breast self awareness with Nikki Dom. Pap smear completed today. Let her know [facility] will cover Pap smears every 3 years unless has a history of abnormal Pap smears. Referred patient to the [facility] for a right breast ultrasound. Appointment scheduled Tuesday, May 17, 2021 at 0950. Patient aware of appointment and will be there. Let patient know will follow up with her within the next couple weeks with results of her Pap smear by phone. Salvadore Farber Bermudez verbalized understanding.  Milagro Belmares, Kathaleen Maser, RN 8:43 AM

## 2021-05-19 ENCOUNTER — Telehealth: Payer: Self-pay

## 2021-05-19 LAB — CYTOLOGY - PAP: Diagnosis: NEGATIVE

## 2021-05-19 NOTE — Telephone Encounter (Signed)
Patient informed negative pap results, next pap smear is due in 3 years. Patient verbalized understanding. Patient stated that after the pap smear she had some cramping and vaginal bleeding x 1 day, needs to know if this is normal, she is okay now. Patient informed it is normal to have some slight bleeding/spotting and discomfort similar to mild menstrual cramps after a pap smear. Patient informed to continue monitor for any ongoing symptoms not related to normal menstrual period, and to call back. Patient verbalized understanding.

## 2021-05-25 ENCOUNTER — Telehealth: Payer: Self-pay | Admitting: Obstetrics and Gynecology

## 2021-06-16 ENCOUNTER — Ambulatory Visit: Payer: Self-pay

## 2021-06-19 ENCOUNTER — Emergency Department (HOSPITAL_COMMUNITY): Payer: Self-pay

## 2021-06-19 ENCOUNTER — Emergency Department (HOSPITAL_COMMUNITY)
Admission: EM | Admit: 2021-06-19 | Discharge: 2021-06-19 | Disposition: A | Discharge status: Home / Self Care | Payer: Self-pay | Attending: Emergency Medicine | Admitting: Emergency Medicine

## 2021-06-19 ENCOUNTER — Other Ambulatory Visit: Payer: Self-pay

## 2021-06-19 DIAGNOSIS — R1011 Right upper quadrant pain: Secondary | ICD-10-CM

## 2021-06-19 DIAGNOSIS — K760 Fatty (change of) liver, not elsewhere classified: Secondary | ICD-10-CM | POA: Insufficient documentation

## 2021-06-19 LAB — CBC
HCT: 43.6 % (ref 36.0–46.0)
Hemoglobin: 14.2 g/dL (ref 12.0–15.0)
MCH: 29.7 pg (ref 26.0–34.0)
MCHC: 32.6 g/dL (ref 30.0–36.0)
MCV: 91.2 fL (ref 80.0–100.0)
Platelets: 300 10*3/uL (ref 150–400)
RBC: 4.78 MIL/uL (ref 3.87–5.11)
RDW: 12.4 % (ref 11.5–15.5)
WBC: 9 10*3/uL (ref 4.0–10.5)
nRBC: 0 % (ref 0.0–0.2)

## 2021-06-19 LAB — COMPREHENSIVE METABOLIC PANEL
ALT: 31 U/L (ref 0–44)
AST: 21 U/L (ref 15–41)
Albumin: 4.3 g/dL (ref 3.5–5.0)
Alkaline Phosphatase: 80 U/L (ref 38–126)
Anion gap: 9 (ref 5–15)
BUN: 15 mg/dL (ref 6–20)
CO2: 24 mmol/L (ref 22–32)
Calcium: 9.1 mg/dL (ref 8.9–10.3)
Chloride: 104 mmol/L (ref 98–111)
Creatinine, Ser: 0.66 mg/dL (ref 0.44–1.00)
GFR, Estimated: >60 mL/min (ref >60)
Glucose, Bld: 114 mg/dL — ABNORMAL HIGH (ref 70–99)
Potassium: 3.8 mmol/L (ref 3.5–5.1)
Sodium: 137 mmol/L (ref 135–145)
Total Bilirubin: 0.5 mg/dL (ref 0.3–1.2)
Total Protein: 7.3 g/dL (ref 6.5–8.1)

## 2021-06-19 LAB — URINALYSIS, ROUTINE W REFLEX MICROSCOPIC
Bilirubin Urine: NEGATIVE
Glucose, UA: NEGATIVE mg/dL
Hgb urine dipstick: NEGATIVE
Ketones, ur: NEGATIVE mg/dL
Leukocytes,Ua: NEGATIVE
Nitrite: NEGATIVE
Protein, ur: NEGATIVE mg/dL
Specific Gravity, Urine: 1.01 (ref 1.005–1.030)
pH: 7 (ref 5.0–8.0)

## 2021-06-19 LAB — LIPASE, BLOOD: Lipase: 37 U/L (ref 11–51)

## 2021-06-19 LAB — I-STAT BETA HCG BLOOD, ED (MC, WL, AP ONLY): I-stat hCG, quantitative: <5 m[IU]/mL (ref <5)

## 2021-06-19 MED ORDER — ALUM & MAG HYDROXIDE-SIMETH 200-200-20 MG/5ML PO SUSP
30.0000 mL | Freq: Once | ORAL | Status: AC
  Administered 2021-06-19: 30 mL via ORAL
  Filled 2021-06-19: qty 30

## 2021-06-19 MED ORDER — CYCLOBENZAPRINE HCL 10 MG PO TABS
5.0000 mg | ORAL_TABLET | Freq: Once | ORAL | Status: AC
Start: 2021-06-19 — End: 2021-06-19
  Administered 2021-06-19: 5 mg via ORAL
  Filled 2021-06-19: qty 1

## 2021-06-19 MED ORDER — ACETAMINOPHEN 500 MG PO TABS
1000.0000 mg | ORAL_TABLET | Freq: Once | ORAL | Status: AC
  Administered 2021-06-19: 1000 mg via ORAL
  Filled 2021-06-19: qty 2

## 2021-06-19 NOTE — ED Triage Notes (Signed)
Pt here for epigastric abd pain that started an hour ago, worse w/ movement. Pt states when she bends down or stands up it radiates through to her back, has had some shob due to pain. Pt states it feels like a lot of pressure, denies cp, no N/V/D.

## 2021-06-21 ENCOUNTER — Ambulatory Visit: Payer: Self-pay

## 2021-06-21 NOTE — ED Provider Notes (Signed)
[facility] EMERGENCY DEPARTMENT Provider Note   CSN: [id number] Arrival date & time: 06/19/21  0021     History  Chief Complaint  Patient presents with   Abdominal Pain    Lydia Petersen is a 26 y.o. female.   Abdominal Pain Pain location:  Epigastric Pain quality: aching and pressure   Pain radiates to:  Back Pain severity:  Mild Onset quality:  Gradual Duration:  1 hour Timing:  Intermittent Progression:  Waxing and waning Chronicity:  New Context: not alcohol use, not eating and not recent illness       Home Medications Prior to Admission medications   Not on File      Allergies    Patient has no known allergies.    Review of Systems   Review of Systems  Gastrointestinal:  Positive for abdominal pain.   Physical Exam Updated Vital Signs BP 113/62    Pulse 62    Temp 97.9 F (36.6 C)    Resp 17    LMP 06/10/2021 (Exact Date)    SpO2 100%  Physical Exam Vitals and nursing note reviewed.  Constitutional:      Appearance: She is well-developed.  HENT:     Head: Normocephalic and atraumatic.     Nose: No congestion or rhinorrhea.  Eyes:     Pupils: Pupils are equal, round, and reactive to light.  Cardiovascular:     Rate and Rhythm: Normal rate and regular rhythm.  Pulmonary:     Effort: No respiratory distress.     Breath sounds: No stridor.  Abdominal:     General: Abdomen is flat. There is no distension.  Musculoskeletal:     Cervical back: Normal range of motion.  Skin:    General: Skin is warm and dry.  Neurological:     General: No focal deficit present.     Mental Status: She is alert.    ED Results / Procedures / Treatments   Labs (all labs ordered are listed, but only abnormal results are displayed) Labs Reviewed  COMPREHENSIVE METABOLIC PANEL - Abnormal; Notable for the following components:      Result Value   Glucose, Bld 114 (*)    All other components within normal limits  LIPASE, BLOOD  CBC   URINALYSIS, ROUTINE W REFLEX MICROSCOPIC  I-STAT BETA HCG BLOOD, ED ([facility], [facility], [facility] ONLY)    EKG EKG Interpretation  Date/Time:  Sunday June 19 2021 00:36:11 EST Ventricular Rate:  79 PR Interval:  122 QRS Duration: 84 QT Interval:  374 QTC Calculation: 428 R Axis:   64 Text Interpretation: Normal sinus rhythm Normal ECG When compared with ECG of 19-Jun-2021 00:35, PREVIOUS ECG IS PRESENT Confirmed by Marily Memos [phone]) on 06/19/2021 3:26:08 AM  Radiology No results found.  Procedures Procedures    Medications Ordered in ED Medications  alum & mag hydroxide-simeth (MAALOX/MYLANTA) 200-200-20 MG/5ML suspension 30 mL (30 mLs Oral Given 06/19/21 0304)  acetaminophen (TYLENOL) tablet 1,000 mg (1,000 mg Oral Given 06/19/21 0304)  cyclobenzaprine (FLEXERIL) tablet 5 mg (5 mg Oral Given 06/19/21 0304)    ED Course/ Medical Decision Making/ A&P                           Medical Decision Making Amount and/or Complexity of Data Reviewed Labs: ordered. Radiology: ordered.  Risk OTC drugs. Prescription drug management.   Pain-free with medications.  Work-up without any significant abnormalities besides hepatic steatosis.  No vomiting.  No other associated symptoms.  Patient appears to be stable for discharge at this time.   Final Clinical Impression(s) / ED Diagnoses Final diagnoses:  RUQ pain  Hepatic steatosis    Rx / DC Orders ED Discharge Orders     None         Linken Mcglothen, Barbara Cower, MD 06/21/21 [phone]

## 2021-10-21 ENCOUNTER — Other Ambulatory Visit: Payer: Self-pay

## 2021-10-21 ENCOUNTER — Encounter (HOSPITAL_COMMUNITY): Payer: Self-pay | Admitting: Emergency Medicine

## 2021-10-21 ENCOUNTER — Emergency Department (HOSPITAL_COMMUNITY)
Admission: EM | Admit: 2021-10-21 | Discharge: 2021-10-22 | Disposition: A | Discharge status: Home / Self Care | Payer: Self-pay | Attending: Emergency Medicine | Admitting: Emergency Medicine

## 2021-10-21 ENCOUNTER — Emergency Department (HOSPITAL_COMMUNITY): Payer: Self-pay

## 2021-10-21 DIAGNOSIS — S46812A Strain of other muscles, fascia and tendons at shoulder and upper arm level, left arm, initial encounter: Secondary | ICD-10-CM | POA: Insufficient documentation

## 2021-10-21 DIAGNOSIS — X58XXXA Exposure to other specified factors, initial encounter: Secondary | ICD-10-CM | POA: Insufficient documentation

## 2021-10-21 DIAGNOSIS — S46912A Strain of unspecified muscle, fascia and tendon at shoulder and upper arm level, left arm, initial encounter: Secondary | ICD-10-CM

## 2021-10-21 DIAGNOSIS — R0789 Other chest pain: Secondary | ICD-10-CM | POA: Insufficient documentation

## 2021-10-21 DIAGNOSIS — M546 Pain in thoracic spine: Secondary | ICD-10-CM | POA: Insufficient documentation

## 2021-10-21 LAB — I-STAT BETA HCG BLOOD, ED (MC, WL, AP ONLY): I-stat hCG, quantitative: <5 m[IU]/mL (ref <5)

## 2021-10-21 NOTE — ED Provider Triage Note (Incomplete)
Emergency Medicine Provider Triage Evaluation Note  Lydia Petersen , a 26 y.o. female  was evaluated in triage.  Pt complains of chest pain that began tonight while cooking.  States pain sharp in left upper chest, severe with deep breathing.  She does feel some pain in her left upper back as well.  She denies cough, fever, recent URI symptoms.  No cardiac history.  No history of blood clots.  No recent travel or exogenous estrogen..  Review of Systems  Positive: Chest pain Negative: Fever, cough  Physical Exam  BP (!) 141/94 (BP Location: Left Arm)   Pulse 72   Temp 99.7 F (37.6 C)   Resp 18   SpO2 100%  Gen:   Awake, no distress   Resp:  Normal effort  MSK:   Moves extremities without difficulty  Other:   Pain elicited with deep breathing during triage exam  Medical Decision Making  Medically screening exam initiated at 11:16 PM.  Appropriate orders placed.  Lydia Petersen was informed that the remainder of the evaluation will be completed by another provider, this initial triage assessment does not replace that evaluation, and the importance of remaining in the ED until their evaluation is complete.  Chest pain.  Does have some pleuritic component.  Will check EKG, labs including D-dimer, chest x-ray.

## 2021-10-21 NOTE — ED Provider Triage Note (Signed)
Emergency Medicine Provider Triage Evaluation Note  Lydia Petersen , a 26 y.o. female  was evaluated in triage.  Pt complains of chest pain that began tonight while cooking.  States pain sharp in left upper chest, severe with deep breathing.  She does feel some pain in her left upper back as well.  She denies cough, fever, recent URI symptoms.  No cardiac history.  No history of blood clots.  No recent travel or exogenous estrogen..  Review of Systems  Positive: Chest pain Negative: Fever, cough  Physical Exam  BP (!) 141/94 (BP Location: Left Arm)   Pulse 72   Temp 99.7 F (37.6 C)   Resp 18   SpO2 100%  Gen:   Awake, no distress   Resp:  Normal effort  MSK:   Moves extremities without difficulty  Other:   Pain elicited with deep breathing during triage exam  Medical Decision Making  Medically screening exam initiated at 11:16 PM.  Appropriate orders placed.  Harmoni Lucus was informed that the remainder of the evaluation will be completed by another provider, this initial triage assessment does not replace that evaluation, and the importance of remaining in the ED until their evaluation is complete.  Chest pain.  Does have some pleuritic component.  Will check EKG, labs including D-dimer, chest x-ray.   Garlon Hatchet, PA-C 10/22/21 0040

## 2021-10-21 NOTE — ED Triage Notes (Signed)
Pt reported to ED with c/o left sided chest pain that radiates into left arm. She states that pain increases with inspiration. She also reports feelings of numbness on lower back.

## 2021-10-22 LAB — CBC WITH DIFFERENTIAL/PLATELET
Abs Immature Granulocytes: 0.02 10*3/uL (ref 0.00–0.07)
Basophils Absolute: 0 10*3/uL (ref 0.0–0.1)
Basophils Relative: 0 %
Eosinophils Absolute: 0.1 10*3/uL (ref 0.0–0.5)
Eosinophils Relative: 1 %
HCT: 44.4 % (ref 36.0–46.0)
Hemoglobin: 14.6 g/dL (ref 12.0–15.0)
Immature Granulocytes: 0 %
Lymphocytes Relative: 36 %
Lymphs Abs: 2.6 10*3/uL (ref 0.7–4.0)
MCH: 29.9 pg (ref 26.0–34.0)
MCHC: 32.9 g/dL (ref 30.0–36.0)
MCV: 90.8 fL (ref 80.0–100.0)
Monocytes Absolute: 0.4 10*3/uL (ref 0.1–1.0)
Monocytes Relative: 5 %
Neutro Abs: 4.3 10*3/uL (ref 1.7–7.7)
Neutrophils Relative %: 58 %
Platelets: 284 10*3/uL (ref 150–400)
RBC: 4.89 MIL/uL (ref 3.87–5.11)
RDW: 12.3 % (ref 11.5–15.5)
WBC: 7.3 10*3/uL (ref 4.0–10.5)
nRBC: 0 % (ref 0.0–0.2)

## 2021-10-22 LAB — BASIC METABOLIC PANEL
Anion gap: 8 (ref 5–15)
BUN: 15 mg/dL (ref 6–20)
CO2: 24 mmol/L (ref 22–32)
Calcium: 9 mg/dL (ref 8.9–10.3)
Chloride: 106 mmol/L (ref 98–111)
Creatinine, Ser: 0.73 mg/dL (ref 0.44–1.00)
GFR, Estimated: >60 mL/min (ref >60)
Glucose, Bld: 109 mg/dL — ABNORMAL HIGH (ref 70–99)
Potassium: 3.7 mmol/L (ref 3.5–5.1)
Sodium: 138 mmol/L (ref 135–145)

## 2021-10-22 LAB — D-DIMER, QUANTITATIVE: D-Dimer, Quant: <0.27 ug/mL-FEU (ref 0.00–0.50)

## 2021-10-22 LAB — TROPONIN I (HIGH SENSITIVITY)
Troponin I (High Sensitivity): 3 ng/L (ref <18)
Troponin I (High Sensitivity): 3 ng/L (ref <18)

## 2021-10-22 MED ORDER — KETOROLAC TROMETHAMINE 60 MG/2ML IM SOLN
30.0000 mg | Freq: Once | INTRAMUSCULAR | Status: AC
  Administered 2021-10-22: 30 mg via INTRAMUSCULAR
  Filled 2021-10-22: qty 2

## 2021-10-22 NOTE — ED Provider Notes (Signed)
[facility] EMERGENCY DEPARTMENT Provider Note  CSN: [id number] Arrival date & time: 10/21/21 2246  Chief Complaint(s) Chest Pain  HPI Lydia Petersen is a 26 y.o. female who presents to the emergency department with sudden onset left-sided chest aching.  Initially mild but progressively worsened throughout the night.  Pain also felt and parascapular area on the left.  Reports that pain radiated to the left arm.  It was made worse with movement and palpation as well as taking deep breath.  She denies any recent fevers or infections.  No coughing or congestion.  No prior history of DVT/PEs.  No recent prolonged immobilization.  No history of autoimmune disorders or active cancer.   Chest Pain   Past Medical History Past Medical History:  Diagnosis Date   Medical history non-contributory    Patient Active Problem List   Diagnosis Date Noted   Injury of nose 07/24/2020   Acute intractable headache 07/24/2020   Dizziness and giddiness 07/24/2020   PROM (premature rupture of membranes) 12/29/2016   Yeast infection 12/26/2016   Supervision of normal first pregnancy, antepartum 11/30/2016   Home Medication(s) Prior to Admission medications   Not on File                                                                                                                                    Allergies Patient has no known allergies.  Review of Systems Review of Systems  Cardiovascular:  Positive for chest pain.   As noted in HPI  Physical Exam Vital Signs  I have reviewed the triage vital signs BP 115/75 (BP Location: Right Arm)   Pulse 66   Temp 99.7 F (37.6 C)   Resp 16   SpO2 100%   Physical Exam Vitals reviewed.  Constitutional:      General: She is not in acute distress.    Appearance: She is well-developed. She is not diaphoretic.  HENT:     Head: Normocephalic and atraumatic.     Nose: Nose normal.  Eyes:     General: No scleral icterus.        Right eye: No discharge.        Left eye: No discharge.     Conjunctiva/sclera: Conjunctivae normal.     Pupils: Pupils are equal, round, and reactive to light.  Cardiovascular:     Rate and Rhythm: Normal rate and regular rhythm.     Heart sounds: No murmur heard.    No friction rub. No gallop.  Pulmonary:     Effort: Pulmonary effort is normal. No respiratory distress.     Breath sounds: Normal breath sounds. No stridor. No rales.  Chest:     Chest wall: Tenderness present.    Abdominal:     General: There is no distension.     Palpations: Abdomen is soft.     Tenderness: There is no abdominal tenderness.  Musculoskeletal:     Cervical back: Normal range of motion and neck supple.     Thoracic back: Spasms and tenderness present.       Back:  Skin:    General: Skin is warm and dry.     Findings: No erythema or rash.  Neurological:     Mental Status: She is alert and oriented to person, place, and time.     ED Results and Treatments Labs (all labs ordered are listed, but only abnormal results are displayed) Labs Reviewed  BASIC METABOLIC PANEL - Abnormal; Notable for the following components:      Result Value   Glucose, Bld 109 (*)    All other components within normal limits  CBC WITH DIFFERENTIAL/PLATELET  D-DIMER, QUANTITATIVE  I-STAT BETA HCG BLOOD, ED ([facility], [facility], [facility] ONLY)  TROPONIN I (HIGH SENSITIVITY)  TROPONIN I (HIGH SENSITIVITY)                                                                                                                         EKG  EKG Interpretation  Date/Time:    Ventricular Rate:    PR Interval:    QRS Duration:   QT Interval:    QTC Calculation:   R Axis:     Text Interpretation:         Radiology DG Chest 2 View  Result Date: 10/21/2021 CLINICAL DATA:  Chest pain EXAM: CHEST - 2 VIEW COMPARISON:  06/19/2021 FINDINGS: The heart size and mediastinal contours are within normal limits. Both lungs are clear. The  visualized skeletal structures are unremarkable. IMPRESSION: Normal study Electronically Signed   By: Rolm Baptise M.D.   On: 10/21/2021 23:38    Pertinent labs & imaging results that were available during my care of the patient were reviewed by me and considered in my medical decision making (see MDM for details).  Medications Ordered in ED Medications  ketorolac (TORADOL) injection 30 mg (has no administration in time range)                                                                                                                                     Procedures Procedures  (including critical care time)  Medical Decision Making / ED Course    Complexity of Problem:  Patient's presenting problem/concern, DDX, and MDM listed below: Left-sided chest pain We will rule out for ACS  and PE.  We will also assess for pneumonia or pneumothorax. Less concern for aortic dissection or esophageal perforation. Feel it is most likely muscular strain/spasm.    Complexity of Data:   Cardiac Monitoring: EKG without acute ischemic changes or evidence of pericarditis  Laboratory Tests ordered listed below with my independent interpretation: CBC without leukocytosis or anemia No significant electrolyte derangements or renal sufficiency Serial troponins negative x2 Dimer negative   Imaging Studies ordered listed below with my independent interpretation: Chest x-ray without evidence of pneumonia, pneumothorax, pulmonary edema or pleural effusion     ED Course:    Assessment, Add'l Intervention, and Reassessment: Left-sided chest pain Work-up has been reassuring Most consistent with muscle strain/spasm of the left shoulder girdle musculature Provided with IM Toradol Supportive management recommended    Final Clinical Impression(s) / ED Diagnoses Final diagnoses:  Muscle strain, shoulder region, left, initial encounter   The patient appears reasonably screened and/or stabilized for  discharge and I doubt any other medical condition or other [facility] requiring further screening, evaluation, or treatment in the ED at this time prior to discharge. Safe for discharge with strict return precautions.  Disposition: Discharge  Condition: Good  I have discussed the results, Dx and Tx plan with the patient/family who expressed understanding and agree(s) with the plan. Discharge instructions discussed at length. The patient/family was given strict return precautions who verbalized understanding of the instructions. No further questions at time of discharge.    ED Discharge Orders     None        Follow Up: Primary care provider  Schedule an appointment as soon as possible for a visit             This chart was dictated using voice recognition software.  Despite best efforts to proofread,  errors can occur which can change the documentation meaning.    Fatima Blank, MD 10/22/21 [phone]

## 2021-10-22 NOTE — Discharge Instructions (Signed)
You may use over-the-counter Motrin (Ibuprofen), Acetaminophen (Tylenol), topical muscle creams such as SalonPas, Icy Hot, Bengay, etc. Please stretch, apply ice or heat (whichever helps), and have massage therapy for additional assistance.  

## 2022-12-22 IMAGING — CR DG CHEST 2V
2 series · 2 of 2 positions shown · non-contrast
Comparison: 06/19/2021

CLINICAL DATA: Chest pain

EXAM:
CHEST - 2 VIEW

[chest pa]
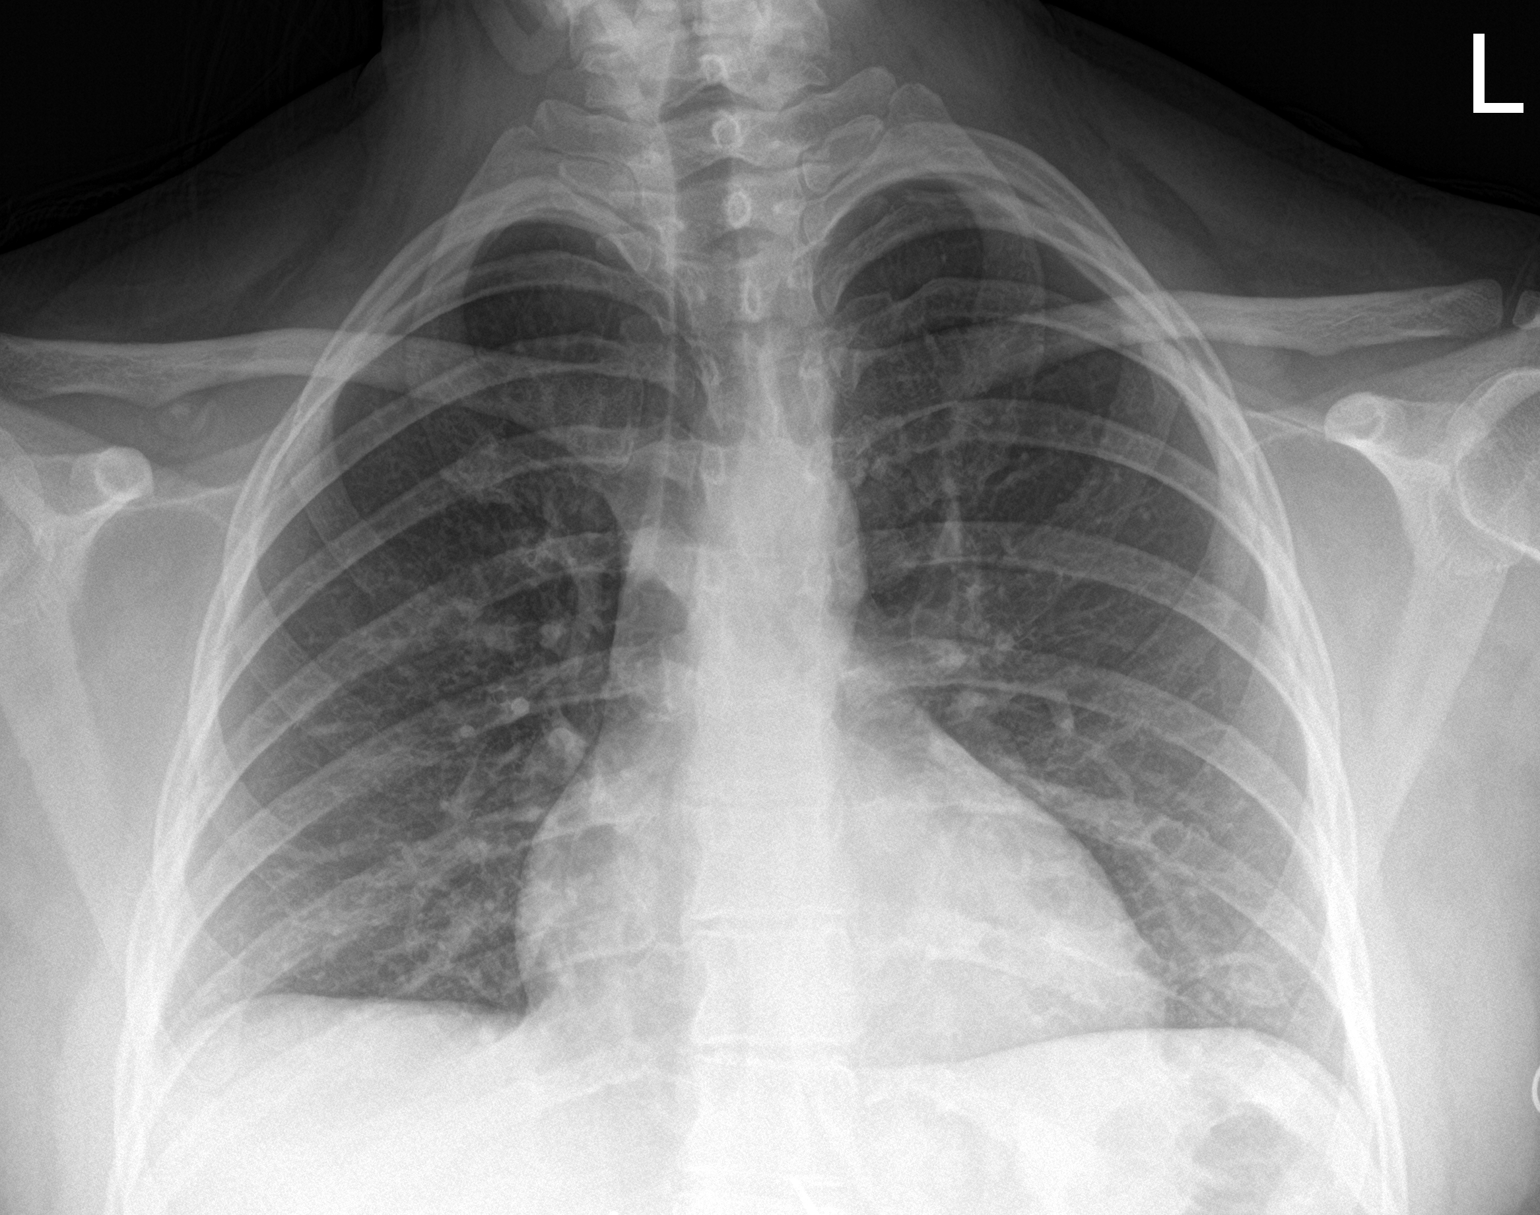

[chest lat]
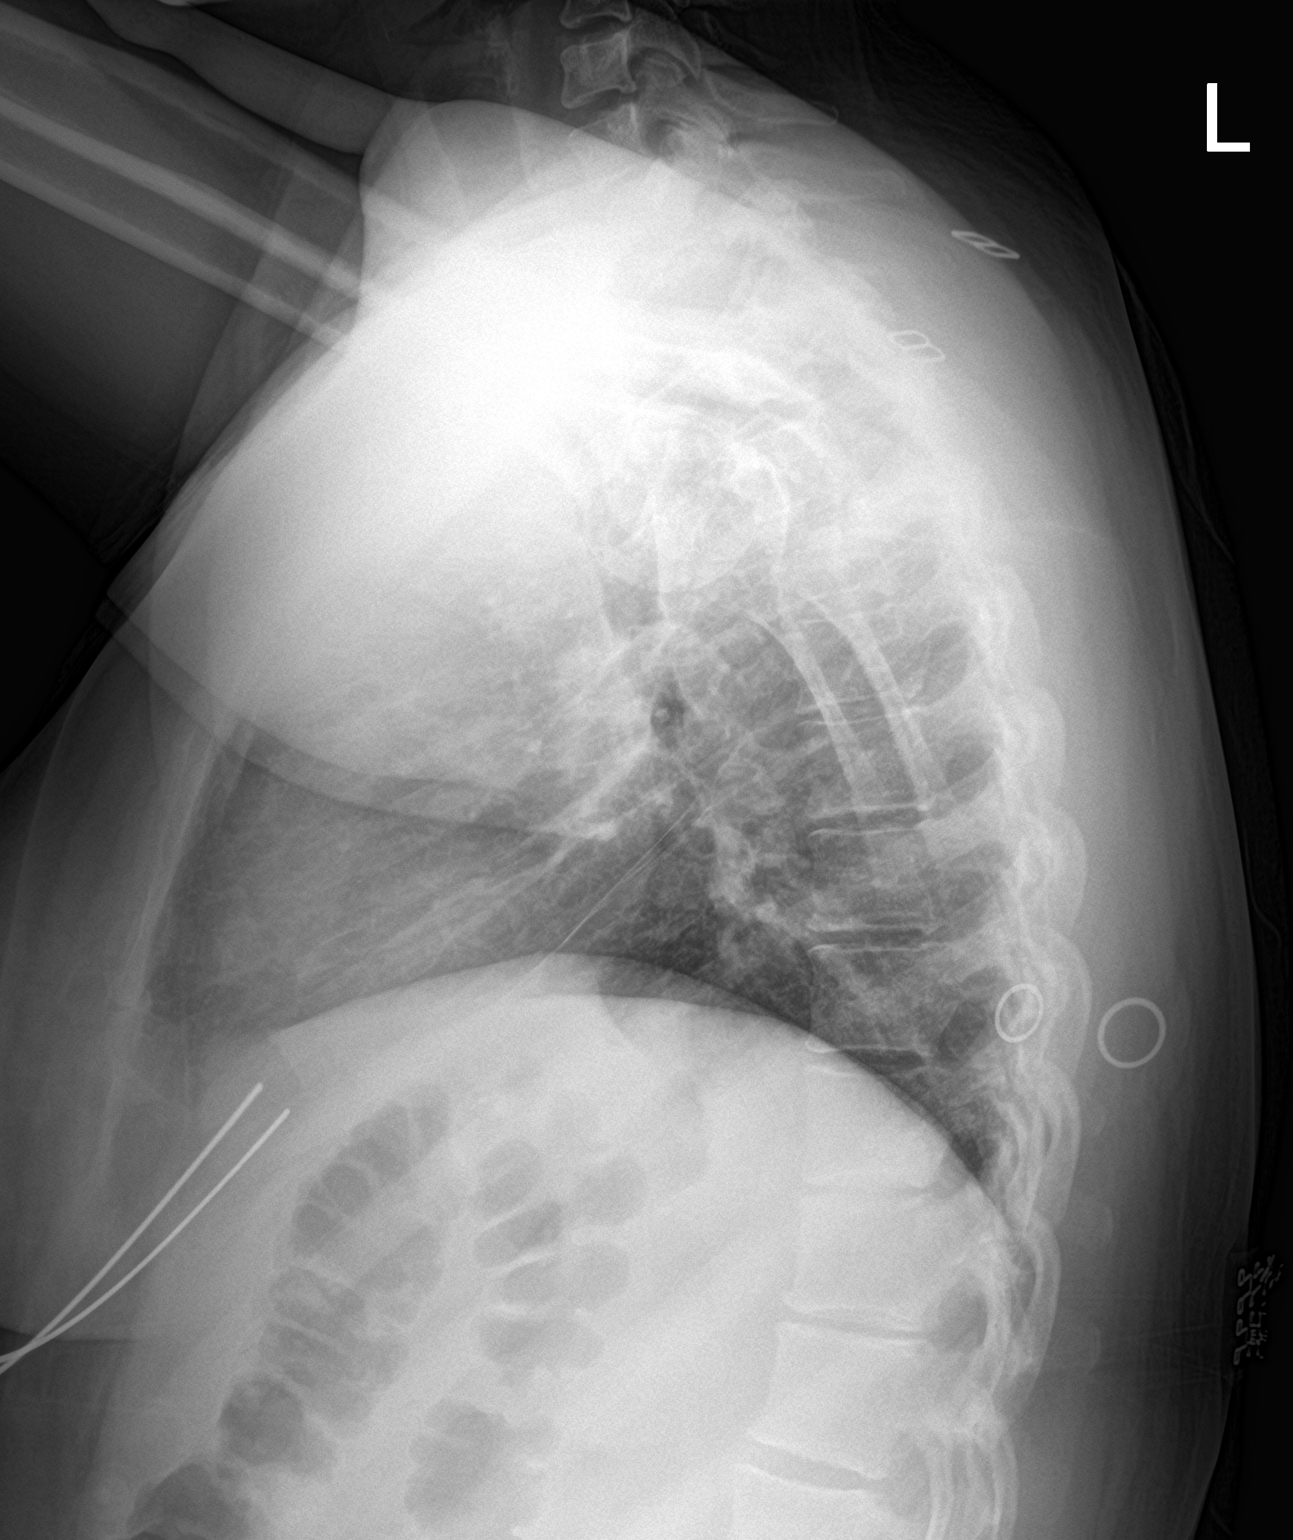

[2 of 2 positions shown; findings below may reference images not displayed]

FINDINGS: The heart size and mediastinal contours are within normal limits.
Both lungs are clear. The visualized skeletal structures are
unremarkable.
IMPRESSION: Normal study

## 2023-11-22 NOTE — Telephone Encounter (Signed)
 Open to sign enouter
